# Patient Record
Sex: Male | Born: 1960 | Race: White | Hispanic: No | Marital: Married | Smoking: Never smoker
Health system: Southern US, Community
[De-identification: ages and names within clinical notes are randomized; demographics above are authoritative.]

## PROBLEM LIST (undated history)

## (undated) DIAGNOSIS — M659 Synovitis and tenosynovitis, unspecified: Secondary | ICD-10-CM

## (undated) DIAGNOSIS — M25462 Effusion, left knee: Secondary | ICD-10-CM

## (undated) DIAGNOSIS — K529 Noninfective gastroenteritis and colitis, unspecified: Secondary | ICD-10-CM

## (undated) DIAGNOSIS — M2392 Unspecified internal derangement of left knee: Secondary | ICD-10-CM

## (undated) DIAGNOSIS — S83249A Other tear of medial meniscus, current injury, unspecified knee, initial encounter: Secondary | ICD-10-CM

## (undated) HISTORY — DX: Synovitis and tenosynovitis, unspecified: M65.9

## (undated) HISTORY — DX: Unspecified internal derangement of left knee: M23.92

## (undated) HISTORY — DX: Other tear of medial meniscus, current injury, unspecified knee, initial encounter: S83.249A

## (undated) HISTORY — DX: Effusion, left knee: M25.462

## (undated) HISTORY — DX: Noninfective gastroenteritis and colitis, unspecified: K52.9

---

## 2004-08-13 ENCOUNTER — Ambulatory Visit: Payer: Self-pay | Admitting: Internal Medicine

## 2004-08-20 ENCOUNTER — Encounter: Admission: RE | Admit: 2004-08-20 | Discharge: 2004-08-20 | Payer: Self-pay | Admitting: Internal Medicine

## 2004-08-20 ENCOUNTER — Ambulatory Visit: Payer: Self-pay | Admitting: Internal Medicine

## 2005-04-29 DIAGNOSIS — K6389 Other specified diseases of intestine: Secondary | ICD-10-CM

## 2005-04-29 HISTORY — DX: Other specified diseases of intestine: K63.89

## 2005-08-28 ENCOUNTER — Ambulatory Visit: Payer: Self-pay | Admitting: Internal Medicine

## 2005-11-04 ENCOUNTER — Ambulatory Visit: Payer: Self-pay | Admitting: Internal Medicine

## 2006-03-29 LAB — HM COLONOSCOPY

## 2006-06-05 ENCOUNTER — Ambulatory Visit: Payer: Self-pay | Admitting: Internal Medicine

## 2006-10-23 ENCOUNTER — Telehealth (INDEPENDENT_AMBULATORY_CARE_PROVIDER_SITE_OTHER): Payer: Self-pay | Admitting: *Deleted

## 2007-03-08 ENCOUNTER — Telehealth: Payer: Self-pay | Admitting: Internal Medicine

## 2007-12-21 ENCOUNTER — Ambulatory Visit: Payer: Self-pay | Admitting: Internal Medicine

## 2007-12-21 DIAGNOSIS — M109 Gout, unspecified: Secondary | ICD-10-CM

## 2007-12-23 ENCOUNTER — Telehealth (INDEPENDENT_AMBULATORY_CARE_PROVIDER_SITE_OTHER): Payer: Self-pay | Admitting: *Deleted

## 2007-12-23 LAB — CONVERTED CEMR LAB
AST: 21 units/L (ref 0–37)
Albumin: 4 g/dL (ref 3.5–5.2)
Basophils Absolute: 0 10*3/uL (ref 0.0–0.1)
Basophils Relative: 0.3 % (ref 0.0–3.0)
Bilirubin, Direct: 0.1 mg/dL (ref 0.0–0.3)
CO2: 31 meq/L (ref 19–32)
Calcium: 9.2 mg/dL (ref 8.4–10.5)
Chloride: 108 meq/L (ref 96–112)
Eosinophils Relative: 0.9 % (ref 0.0–5.0)
Glucose, Bld: 90 mg/dL (ref 70–99)
HCT: 45.2 % (ref 39.0–52.0)
Lymphocytes Relative: 18.6 % (ref 12.0–46.0)
MCV: 86.2 fL (ref 78.0–100.0)
Monocytes Absolute: 1 10*3/uL (ref 0.1–1.0)
Monocytes Relative: 10.3 % (ref 3.0–12.0)
Platelets: 287 10*3/uL (ref 150–400)
RBC: 5.24 M/uL (ref 4.22–5.81)
RDW: 12.2 % (ref 11.5–14.6)

## 2008-01-22 ENCOUNTER — Ambulatory Visit: Payer: Self-pay | Admitting: Internal Medicine

## 2008-01-25 ENCOUNTER — Telehealth (INDEPENDENT_AMBULATORY_CARE_PROVIDER_SITE_OTHER): Payer: Self-pay | Admitting: *Deleted

## 2008-04-18 ENCOUNTER — Ambulatory Visit: Payer: Self-pay | Admitting: Internal Medicine

## 2008-04-18 DIAGNOSIS — J069 Acute upper respiratory infection, unspecified: Secondary | ICD-10-CM | POA: Insufficient documentation

## 2008-04-18 DIAGNOSIS — H6120 Impacted cerumen, unspecified ear: Secondary | ICD-10-CM

## 2008-10-25 ENCOUNTER — Ambulatory Visit: Payer: Self-pay | Admitting: Internal Medicine

## 2008-10-25 DIAGNOSIS — J019 Acute sinusitis, unspecified: Secondary | ICD-10-CM | POA: Insufficient documentation

## 2009-05-19 ENCOUNTER — Telehealth (INDEPENDENT_AMBULATORY_CARE_PROVIDER_SITE_OTHER): Payer: Self-pay | Admitting: *Deleted

## 2010-05-29 NOTE — Progress Notes (Signed)
  Phone Note Call from Patient   Caller: Patient Summary of Call: pt called for rx allopurinol.  rx has been filled since 05/05/09.  Called [facility] for verification, has rx ready for pt.  Called pt left msg to check with pharmacy rx is ready .Kandice Hams  May 19, 2009 12:30 PM  Initial call taken by: Kandice Hams,  May 19, 2009 12:30 PM

## 2010-08-24 ENCOUNTER — Ambulatory Visit (INDEPENDENT_AMBULATORY_CARE_PROVIDER_SITE_OTHER): Payer: BC Managed Care – PPO | Admitting: Internal Medicine

## 2010-08-24 ENCOUNTER — Encounter: Payer: Self-pay | Admitting: Internal Medicine

## 2010-08-24 VITALS — BP 132/88 | HR 98 | Temp 98.1°F | Wt 297.4 lb

## 2010-08-24 DIAGNOSIS — M109 Gout, unspecified: Secondary | ICD-10-CM

## 2010-08-24 MED ORDER — COLCHICINE 0.6 MG PO TABS: 0.6000 mg | ORAL_TABLET | Freq: Three times a day (TID) | ORAL | Status: DC | PRN

## 2010-08-24 MED ORDER — ALLOPURINOL 100 MG PO TABS: 100.0000 mg | ORAL_TABLET | Freq: Every day | ORAL | Status: DC

## 2010-08-24 NOTE — Patient Instructions (Signed)
The first week of allopurinol, take colchicine twice a day every day,then as needed You are due for a physical exam, please schedule!

## 2010-08-24 NOTE — Progress Notes (Signed)
  Subjective:    Patient ID: Danny [name], male    DOB: 10/12/1960, 50 y.o.   MRN: [id number]  HPI Last office visit about 2 years ago. He ran out of allopurinol about 6 months ago. Since then he is having on and off pain at the right foot, dorsum, close to the 2nd and 3th toes. Usually "one or 2 colchicines take care of the problem". No classic podagra. Patient likes his allopurinol and colchicine RF refill  Past Medical History  Diagnosis Date  . Gout   . Epiploic appendagitis 2007   No past surgical history on file.  Review of Systems No chest pain or shortness of breath Note nausea and vomiting or diarrhea.    Objective:   Physical Exam Alert oriented in no apparent distress Lungs are clear to auscultation bilaterally CV: RRR, no murmur Lower extremity edema normal to inspection, good pedal pulses, good capillary refill. No swelling, redness   at the dorsum of the right foot. Mild tenderness.         Assessment & Plan:

## 2010-08-24 NOTE — Assessment & Plan Note (Signed)
Pt c/o foot pain that decreases w/ colchicine, pain is atypical for gout; these was d/w pt, he is reluctant to consider other conditions. Plan: Labs meds If uric acid is normal will have to consider ortho referal ([facility]? [facility]?) Needs physical, pt aware See instructions

## 2010-08-25 LAB — BASIC METABOLIC PANEL
BUN: 16 mg/dL (ref 6–23)
CO2: 29 mEq/L (ref 19–32)
Calcium: 10 mg/dL (ref 8.4–10.5)
Creat: 0.89 mg/dL (ref 0.40–1.50)
Glucose, Bld: 92 mg/dL (ref 70–99)

## 2010-08-25 LAB — ALT: ALT: 37 U/L (ref 0–53)

## 2010-08-27 ENCOUNTER — Telehealth: Payer: Self-pay | Admitting: *Deleted

## 2010-08-27 NOTE — Telephone Encounter (Signed)
Message copied by Army Fossa on Mon Aug 27, 2010 10:17 AM ------      Message from: Willow Ora      Created: Sun Aug 26, 2010  3:04 PM       Advise patient:      Uric acid is high as expected in gout. Other labs normal. Plan is the same

## 2010-08-27 NOTE — Telephone Encounter (Signed)
Message left for patient to return my call.  

## 2010-08-28 ENCOUNTER — Other Ambulatory Visit: Payer: Self-pay | Admitting: Internal Medicine

## 2010-08-28 NOTE — Telephone Encounter (Signed)
Message left for patient to return my call.  

## 2010-08-28 NOTE — Telephone Encounter (Signed)
Pharm did not receive

## 2010-08-29 NOTE — Telephone Encounter (Signed)
Message left for patient to return my call.  

## 2010-08-30 ENCOUNTER — Encounter: Payer: Self-pay | Admitting: *Deleted

## 2010-08-30 NOTE — Telephone Encounter (Signed)
Mailed pt a letter

## 2010-09-14 NOTE — Assessment & Plan Note (Signed)
[facility]                                   ON-CALL NOTE   NAME:[name], Danny HENDRICKSEN                       MRN:          [id number]  DATE:01/14/2006                            DOB:          03/06/1961    The patient called in from [place], [place], stating he has gout.  He said he has tried the colchicine, and it has not helped.  He  has had  these symptoms for a day and a half; however, he did not have time to call  Dr. Drue Novel today because he was too busy.  The patient wants medicine called  in.  Advised him if he has any problems he will need to go to a local urgent  care for eval.  Once that is done, then he needs to see Dr. Drue Novel tomorrow  when he gets back in town.  However, the patient stated he did not have time  to see Dr. Drue Novel tomorrow, he just wanted medicine called in.  Advised the  patient to get medicine at local urgent care tonight, and see Dr. Drue Novel for  eval, since he is having recurrent episodes of gout.                                   [place] A. Tawanna Cooler, MD   JAT/MedQ  DD:  01/14/2006  DT:  01/16/2006  Job #:  [id number]   cc:   Willow Ora, MD

## 2010-09-19 ENCOUNTER — Ambulatory Visit (INDEPENDENT_AMBULATORY_CARE_PROVIDER_SITE_OTHER): Payer: BC Managed Care – PPO | Admitting: Internal Medicine

## 2010-09-19 ENCOUNTER — Encounter: Payer: Self-pay | Admitting: Internal Medicine

## 2010-09-19 DIAGNOSIS — Z0000 Encounter for general adult medical examination without abnormal findings: Secondary | ICD-10-CM

## 2010-09-19 DIAGNOSIS — M109 Gout, unspecified: Secondary | ICD-10-CM

## 2010-09-19 DIAGNOSIS — Z23 Encounter for immunization: Secondary | ICD-10-CM

## 2010-09-19 DIAGNOSIS — Z136 Encounter for screening for cardiovascular disorders: Secondary | ICD-10-CM

## 2010-09-19 LAB — CBC WITH DIFFERENTIAL/PLATELET
Basophils Absolute: 0 10*3/uL (ref 0.0–0.1)
Basophils Relative: 0.5 % (ref 0.0–3.0)
HCT: 45.8 % (ref 39.0–52.0)
Hemoglobin: 15.8 g/dL (ref 13.0–17.0)
MCHC: 34.6 g/dL (ref 30.0–36.0)
Monocytes Absolute: 0.7 10*3/uL (ref 0.1–1.0)
Neutro Abs: 4.5 10*3/uL (ref 1.4–7.7)
Platelets: 280 10*3/uL (ref 150.0–400.0)

## 2010-09-19 LAB — LIPID PANEL
Total CHOL/HDL Ratio: 4
Triglycerides: 122 mg/dL (ref 0.0–149.0)
VLDL: 24.4 mg/dL (ref 0.0–40.0)

## 2010-09-19 LAB — TSH: TSH: 0.96 u[IU]/mL (ref 0.35–5.50)

## 2010-09-19 LAB — URIC ACID: Uric Acid, Serum: 8.4 mg/dL — ABNORMAL HIGH (ref 4.0–7.8)

## 2010-09-19 NOTE — Assessment & Plan Note (Addendum)
Td 1999 and today  FH Colon cancer, pt reports a Cscope w/ Dr Candelaria Stagers 2007: will call GI and get report and next Cscope. (addendum: we called GI, next cscope due 03-2011) occ diarrhea (see ROS) , rec observation , will call if sx increase  No FH prostate ca Labs Diet Exercise  EKG (no old EKGs) NSR , no acute changes

## 2010-09-19 NOTE — Assessment & Plan Note (Addendum)
Improved from recent attack.  Uric Acid was  ~9.  Was re-started on allopurinol Labs

## 2010-09-19 NOTE — Progress Notes (Signed)
  Subjective:    Patient ID: Danny [name], male    DOB: [birth year], 50 y.o.   MRN: [id number]  HPI CPX  Past Medical History  Diagnosis Date  . Gout   . Epiploic appendagitis 2007   No past surgical history on file.  History   Social History  . Marital Status: Married    Spouse Name: N/A    Number of Children: 2  . Years of Education: N/A   Occupational History  . appartment Marketing executive     Social History Main Topics  . Smoking status: Never Smoker   . Smokeless tobacco: Not on file  . Alcohol Use: Yes     socially  . Drug Use: No  . Sexually Active: Not on file   Other Topics Concern  . Not on file   Social History Narrative   Diet: not too good -----exercise: yard work no routine exercise    Family History  Problem Relation Age of Onset  . Adopted: Yes  . Prostate cancer Neg Hx   . Colon cancer Mother 51  . Hypertension Mother   . Coronary artery disease Father 74    F MI  . Stroke Father 49       Review of Systems  Constitutional: Negative for fever and fatigue.  Respiratory: Negative for cough and shortness of breath.   Cardiovascular: Negative for chest pain and leg swelling.  Gastrointestinal: Negative for nausea, abdominal pain and blood in stool.       Occ diarrhea, better after taking vitamins, wonders about IBS  Genitourinary: Negative for dysuria, hematuria and difficulty urinating.  Psychiatric/Behavioral:       No anxiety, depression       Objective:   Physical Exam  Constitutional: He is oriented to person, place, and time. He appears well-developed and well-nourished. No distress.  HENT:  Head: Normocephalic and atraumatic.  Neck: No thyromegaly present.       Normal carotid pulse   Cardiovascular: Normal rate, regular rhythm and normal heart sounds.   No murmur heard. Pulmonary/Chest: Effort normal and breath sounds normal. No respiratory distress. He has no wheezes. He has no rales.  Abdominal: Soft. He exhibits no  distension. There is no tenderness. There is no rebound and no guarding.  Musculoskeletal: He exhibits no edema.  Neurological: He is alert and oriented to person, place, and time.  Skin: Skin is warm and dry. He is not diaphoretic.  Psychiatric: He has a normal mood and affect. His behavior is normal. Judgment and thought content normal.       Assessment & Plan:

## 2010-09-19 NOTE — Patient Instructions (Signed)
Continue same meds If the labs are ok and you feel well, came back in 1 year

## 2010-09-21 ENCOUNTER — Telehealth: Payer: Self-pay | Admitting: *Deleted

## 2010-09-21 NOTE — Telephone Encounter (Signed)
Message left for patient to return my call.  

## 2010-09-21 NOTE — Telephone Encounter (Signed)
Message copied by Leanne Lovely on Fri Sep 21, 2010  9:57 AM ------      Message from: Willow Ora E      Created: Thu Sep 20, 2010  6:27 PM       Advise patient:      His cholesterol is very good !      The liver , thyroid test  and blood count are normal      The uric acid is still elevated, increase allopurinol 100 mg to  2 tablets a day. The first week after he increased the dosage, he needs to take colchicine 3 times a day to prevent a gout attack.      Call a new allopurinol  prescription.      Advised patient also to come back in 4 months to check on his gout.

## 2010-09-21 NOTE — Telephone Encounter (Signed)
Left message for pt on his cell phone.

## 2010-09-25 NOTE — Telephone Encounter (Signed)
Message left for patient to return my call. (on home number, man on cell states this was not Mr.Reeps phone)

## 2010-09-26 NOTE — Telephone Encounter (Signed)
Left message for pt

## 2010-09-27 ENCOUNTER — Encounter: Payer: Self-pay | Admitting: *Deleted

## 2010-09-27 MED ORDER — ALLOPURINOL 100 MG PO TABS: ORAL_TABLET | ORAL | Status: DC

## 2010-09-27 NOTE — Telephone Encounter (Signed)
Mailed letter and rx.

## 2010-10-01 ENCOUNTER — Other Ambulatory Visit: Payer: Self-pay | Admitting: Internal Medicine

## 2010-10-02 NOTE — Telephone Encounter (Signed)
90 and 1 RF 

## 2010-11-12 ENCOUNTER — Telehealth: Payer: Self-pay | Admitting: *Deleted

## 2010-11-12 NOTE — Telephone Encounter (Signed)
Correction of medication

## 2010-11-16 ENCOUNTER — Other Ambulatory Visit: Payer: Self-pay | Admitting: Internal Medicine

## 2010-11-16 MED ORDER — ALLOPURINOL 100 MG PO TABS: ORAL_TABLET | ORAL | Status: DC

## 2010-11-16 NOTE — Telephone Encounter (Signed)
Sent in

## 2011-04-19 ENCOUNTER — Ambulatory Visit: Payer: BC Managed Care – PPO | Admitting: Internal Medicine

## 2012-02-28 ENCOUNTER — Encounter: Payer: Self-pay | Admitting: Internal Medicine

## 2012-02-28 ENCOUNTER — Ambulatory Visit (INDEPENDENT_AMBULATORY_CARE_PROVIDER_SITE_OTHER): Payer: BC Managed Care – PPO | Admitting: Internal Medicine

## 2012-02-28 DIAGNOSIS — R059 Cough, unspecified: Secondary | ICD-10-CM

## 2012-02-28 DIAGNOSIS — J3489 Other specified disorders of nose and nasal sinuses: Secondary | ICD-10-CM

## 2012-02-28 DIAGNOSIS — J019 Acute sinusitis, unspecified: Secondary | ICD-10-CM

## 2012-02-28 DIAGNOSIS — R05 Cough: Secondary | ICD-10-CM

## 2012-02-28 MED ORDER — AZITHROMYCIN 250 MG PO TABS: ORAL_TABLET | ORAL | Status: DC

## 2012-02-28 MED ORDER — HYDROCODONE-HOMATROPINE 5-1.5 MG/5ML PO SYRP: 5.0000 mL | ORAL_SOLUTION | Freq: Three times a day (TID) | ORAL | Status: DC | PRN

## 2012-02-28 NOTE — Progress Notes (Signed)
HPI  Pt presents to the clinic today with cough, sinus pain and pressure for 4 days.  Pt feels like he has been running fever but has not actually taken his temperature. Pt feels like his chest is congested and the cough is worse when he tries to lay down. Pt produces only small amounts of green sputum. Pt has been around many sick contacts at work.  Review of Systems    Past Medical History  Diagnosis Date  . Gout   . Epiploic appendagitis 2007    Family History  Problem Relation Age of Onset  . Adopted: Yes  . Prostate cancer Neg Hx   . Colon cancer Mother 38  . Hypertension Mother   . Coronary artery disease Father 35    F MI  . Stroke Father 41    History   Social History  . Marital Status: Married    Spouse Name: N/A    Number of Children: 2  . Years of Education: N/A   Occupational History  . appartment Marketing executive     Social History Main Topics  . Smoking status: Never Smoker   . Smokeless tobacco: Not on file  . Alcohol Use: Yes     socially  . Drug Use: No  . Sexually Active: Not on file   Other Topics Concern  . Not on file   Social History Narrative   Diet: not too good -----exercise: yard work no routine exercise     Allergies  Allergen Reactions  . Penicillins      Constitutional: Positive headache, fatigue and fever. Denies abrupt weight changes.  HEENT:  Positive eye pain, pressure behind the eyes, facial pain, nasal congestion and sore throat. Denies eye redness, ear pain, ringing in the ears, wax buildup, runny nose or bloody nose. Respiratory: Positive cough and thick green sputum production. Denies difficulty breathing or shortness of breath.  Cardiovascular: Denies chest pain, chest tightness, palpitations or swelling in the hands or feet.   No other specific complaints in a complete review of systems (except as listed in HPI above).  Objective:    General: Appears his stated age, well developed, well nourished in  NAD. HEENT: Head: normal shape and size; Eyes: sclera white, no icterus, conjunctiva pink, PERRLA and EOMs intact; Ears: Tm's gray and intact, normal light reflex; Nose: mucosa pink and moist, septum midline; Throat/Mouth: + PND. Teeth present, mucosa mildly erythematic and moist, no exudate noted, no lesions or ulcerations noted.  Neck: Mild cervical lymphadenopathy. Neck supple, trachea midline. No massses, lumps or thyromegaly present.  Cardiovascular: Normal rate and rhythm. S1,S2 noted.  No murmur, rubs or gallops noted. No JVD or BLE edema. No carotid bruits noted. Pulmonary/Chest: Normal effort and positive vesicular breath sounds.  Upper lobes are slightly diminished but upper lobes are clear bilaterally. No respiratory distress. No wheezes, rales or ronchi noted.      Assessment & Plan:  Cough Acute bacterial sinusitis  Can use a Neti Pot which can be purchased from your local drug store. Azithromax 250 mg x 5 days Hycodan cough syrup for bedtime  RTC as needed or if symptoms persist.

## 2012-02-28 NOTE — Patient Instructions (Signed)

## 2013-04-26 ENCOUNTER — Ambulatory Visit (INDEPENDENT_AMBULATORY_CARE_PROVIDER_SITE_OTHER): Payer: BC Managed Care – PPO | Admitting: *Deleted

## 2013-04-26 DIAGNOSIS — Z2911 Encounter for prophylactic immunotherapy for respiratory syncytial virus (RSV): Secondary | ICD-10-CM

## 2013-04-26 DIAGNOSIS — Z23 Encounter for immunization: Secondary | ICD-10-CM

## 2014-06-28 ENCOUNTER — Ambulatory Visit (HOSPITAL_BASED_OUTPATIENT_CLINIC_OR_DEPARTMENT_OTHER)
Admission: RE | Admit: 2014-06-28 | Discharge: 2014-06-28 | Disposition: A | Discharge status: Home / Self Care | Payer: 59 | Source: Ambulatory Visit | Attending: Internal Medicine | Admitting: Internal Medicine

## 2014-06-28 ENCOUNTER — Ambulatory Visit (INDEPENDENT_AMBULATORY_CARE_PROVIDER_SITE_OTHER): Payer: 59 | Admitting: Internal Medicine

## 2014-06-28 ENCOUNTER — Encounter: Payer: Self-pay | Admitting: Internal Medicine

## 2014-06-28 VITALS — BP 155/113 | HR 131 | Temp 99.0°F | Ht 78.0 in | Wt 309.5 lb

## 2014-06-28 DIAGNOSIS — R059 Cough, unspecified: Secondary | ICD-10-CM

## 2014-06-28 DIAGNOSIS — R05 Cough: Secondary | ICD-10-CM

## 2014-06-28 DIAGNOSIS — R52 Pain, unspecified: Secondary | ICD-10-CM

## 2014-06-28 DIAGNOSIS — B349 Viral infection, unspecified: Secondary | ICD-10-CM

## 2014-06-28 LAB — POCT RAPID STREP A (OFFICE): Rapid Strep A Screen: NEGATIVE

## 2014-06-28 LAB — POCT INFLUENZA A/B
INFLUENZA A, POC: NEGATIVE
Influenza B, POC: NEGATIVE

## 2014-06-28 MED ORDER — OSELTAMIVIR PHOSPHATE 75 MG PO CAPS: 75.0000 mg | ORAL_CAPSULE | Freq: Two times a day (BID) | ORAL | Status: DC

## 2014-06-28 MED ORDER — HYDROCODONE-HOMATROPINE 5-1.5 MG/5ML PO SYRP: 5.0000 mL | ORAL_SOLUTION | Freq: Three times a day (TID) | ORAL | Status: DC | PRN

## 2014-06-28 NOTE — Progress Notes (Signed)
Subjective:    Patient ID: Danny RowanJeffrey T [name], male    DOB: [birth year], 54 y.o.   MRN: [id number]  DOS:  06/28/2014 Type of visit - description : acute Interval history: Symptoms started yesterday with dry and persisting cough, aching all over and a low-grade fever. He is taking a syrup with codeine, Tylenol every 6 hours, and Mucinex this morning. After Mucinex he saw a small amount of sputum coming out. Family members are not affected by any symptoms but reportedly there has been some people sick at work.   Review of Systems Denies any sinus pain or congestion No chest pain or difficulty breathing; no palpitations No nausea, vomiting, diarrhea No hemoptysis Minimal headache, no rash  Past Medical History  Diagnosis Date  . Gout   . Epiploic appendagitis 2007    No past surgical history on file.  History   Social History  . Marital Status: Married    Spouse Name: N/A  . Number of Children: 2  . Years of Education: N/A   Occupational History  . appartment Marketing executivecommunity manager     Social History Main Topics  . Smoking status: Never Smoker   . Smokeless tobacco: Not on file  . Alcohol Use: Yes     Comment: socially  . Drug Use: No  . Sexual Activity: Not on file   Other Topics Concern  . Not on file   Social History Narrative   Diet: not too good -----exercise: yard work no routine exercise         Medication List       This list is accurate as of: 06/28/14  2:13 PM.  Always use your most recent med list.               ADVIL COLD & SINUS LIQUI-GELS 30-200 MG Caps  Generic drug:  Pseudoephedrine-Ibuprofen  Take 1 capsule by mouth as needed.     allopurinol 100 MG tablet  Commonly known as:  ZYLOPRIM  Take 2 tablets daily.     aspirin 81 MG tablet  Take 81 mg by mouth daily.     azithromycin 250 MG tablet  Commonly known as:  ZITHROMAX  Take 2 tablets today, then 1 tablet each day for the next 4 days     HYDROcodone-homatropine 5-1.5 MG/5ML syrup    Commonly known as:  HYCODAN  Take 5 mLs by mouth every 8 (eight) hours as needed for cough.     MENS MULTIVITAMIN PLUS PO  Take by mouth daily.           Objective:   Physical Exam BP 155/113 mmHg  Pulse 131  Temp(Src) 99 F (37.2 C) (Oral)  Ht 6\' 6"  (1.981 m)  Wt 309 lb 8 oz (140.388 kg)  BMI 35.77 kg/m2  SpO2 93% General:   Well developed, well nourished . Not diaphoretic, pale or in acute distress. Nontoxic-appearing.  HEENT:  Normocephalic . Face symmetric, atraumatic; nose congested, sinuses not tender to palpation. Throat symmetric, slightly red, no discharge Right TM obscured by wax, left TM normal Neck is full range of motion Lungs:  CTA B Normal respiratory effort, no intercostal retractions, no accessory muscle use. Heart: No murmur, tachycardic with a heart rate of around 110.  Abdomen:  Not distended, soft, non-tender. No rebound or rigidity. No mass,organomegaly Muscle skeletal: no pretibial edema bilaterally  Skin: Not pale. Not jaundice; no rash Neurologic:  alert & oriented X3.  Speech normal, gait appropriate for age and unassisted  Psych--  Cognition and judgment appear intact.  Cooperative with normal attention span and concentration.  Behavior appropriate. No anxious or depressed appearing.       Assessment & Plan:    Viral syndrome, Rapid flu test and strep test negative. The patient wonders if he has the flu and if tamiflu  is appropriate. This could be the flu despite the negative tests consequently Tamiflu is okay. The patient was initially tachycardic at 131, I recheck her his heart rate is 110. He does not look toxic. Plan: Chest x-ray, Tamiflu, rest, fluids, codeine for cough control. ER if symptoms increase, rash, headache etc. See instructions  Also BP elevated, at a screening las year it was 140/86 per pt . Recommend to return to the office for a routine visit

## 2014-06-28 NOTE — Progress Notes (Signed)
Pre visit review using our clinic review tool, if applicable. No additional management support is needed unless otherwise documented below in the visit note. 

## 2014-06-28 NOTE — Patient Instructions (Signed)
Stop by the first floor and get the XR   Rest, fluids , tylenol  If  cough, take Mucinex DM twice a day as needed  Take hydrocodone as needed for persisting cough  Tamiflu was prescribed   Call if not gradually better over the next  10 days  Call anytime if the symptoms are severe, you have high fever, short of breath, chest pain, severe headache, a rash

## 2014-06-30 ENCOUNTER — Telehealth: Payer: Self-pay | Admitting: Internal Medicine

## 2014-06-30 NOTE — Telephone Encounter (Signed)
please call patient on the cell

## 2014-06-30 NOTE — Telephone Encounter (Signed)
Spoke with Pt, informed him of chest x-ray results. Pt informed he is feeling much better and is planning to return work on Friday July 01, 2014. Pt requested work note from March 1 to March 3, and requested I fax to [phone]. Received confirmation fax 06/30/2014 at 15:05.

## 2014-07-01 NOTE — Telephone Encounter (Signed)
Ok to send the work excuse, thx!

## 2014-08-25 LAB — HM COLONOSCOPY: HM COLON: NORMAL

## 2015-04-30 DIAGNOSIS — M65962 Unspecified synovitis and tenosynovitis, left lower leg: Secondary | ICD-10-CM

## 2015-04-30 DIAGNOSIS — M659 Synovitis and tenosynovitis, unspecified: Secondary | ICD-10-CM

## 2015-04-30 DIAGNOSIS — M2392 Unspecified internal derangement of left knee: Secondary | ICD-10-CM

## 2015-04-30 DIAGNOSIS — S83249A Other tear of medial meniscus, current injury, unspecified knee, initial encounter: Secondary | ICD-10-CM

## 2015-04-30 DIAGNOSIS — M25462 Effusion, left knee: Secondary | ICD-10-CM

## 2015-04-30 HISTORY — DX: Unspecified internal derangement of left knee: M23.92

## 2015-04-30 HISTORY — DX: Unspecified synovitis and tenosynovitis, left lower leg: M65.962

## 2015-04-30 HISTORY — DX: Synovitis and tenosynovitis, unspecified: M65.9

## 2015-04-30 HISTORY — DX: Effusion, left knee: M25.462

## 2015-04-30 HISTORY — DX: Other tear of medial meniscus, current injury, unspecified knee, initial encounter: S83.249A

## 2015-04-30 HISTORY — PX: KNEE ARTHROSCOPY WITH LATERAL MENISECTOMY: SHX6193

## 2015-10-18 IMAGING — DX DG CHEST 2V
3 series · 3 of 3 positions shown · non-contrast
Comparison: None.

CLINICAL DATA: Cough for 1 day

EXAM:
CHEST  2 VIEW

[chest pa]
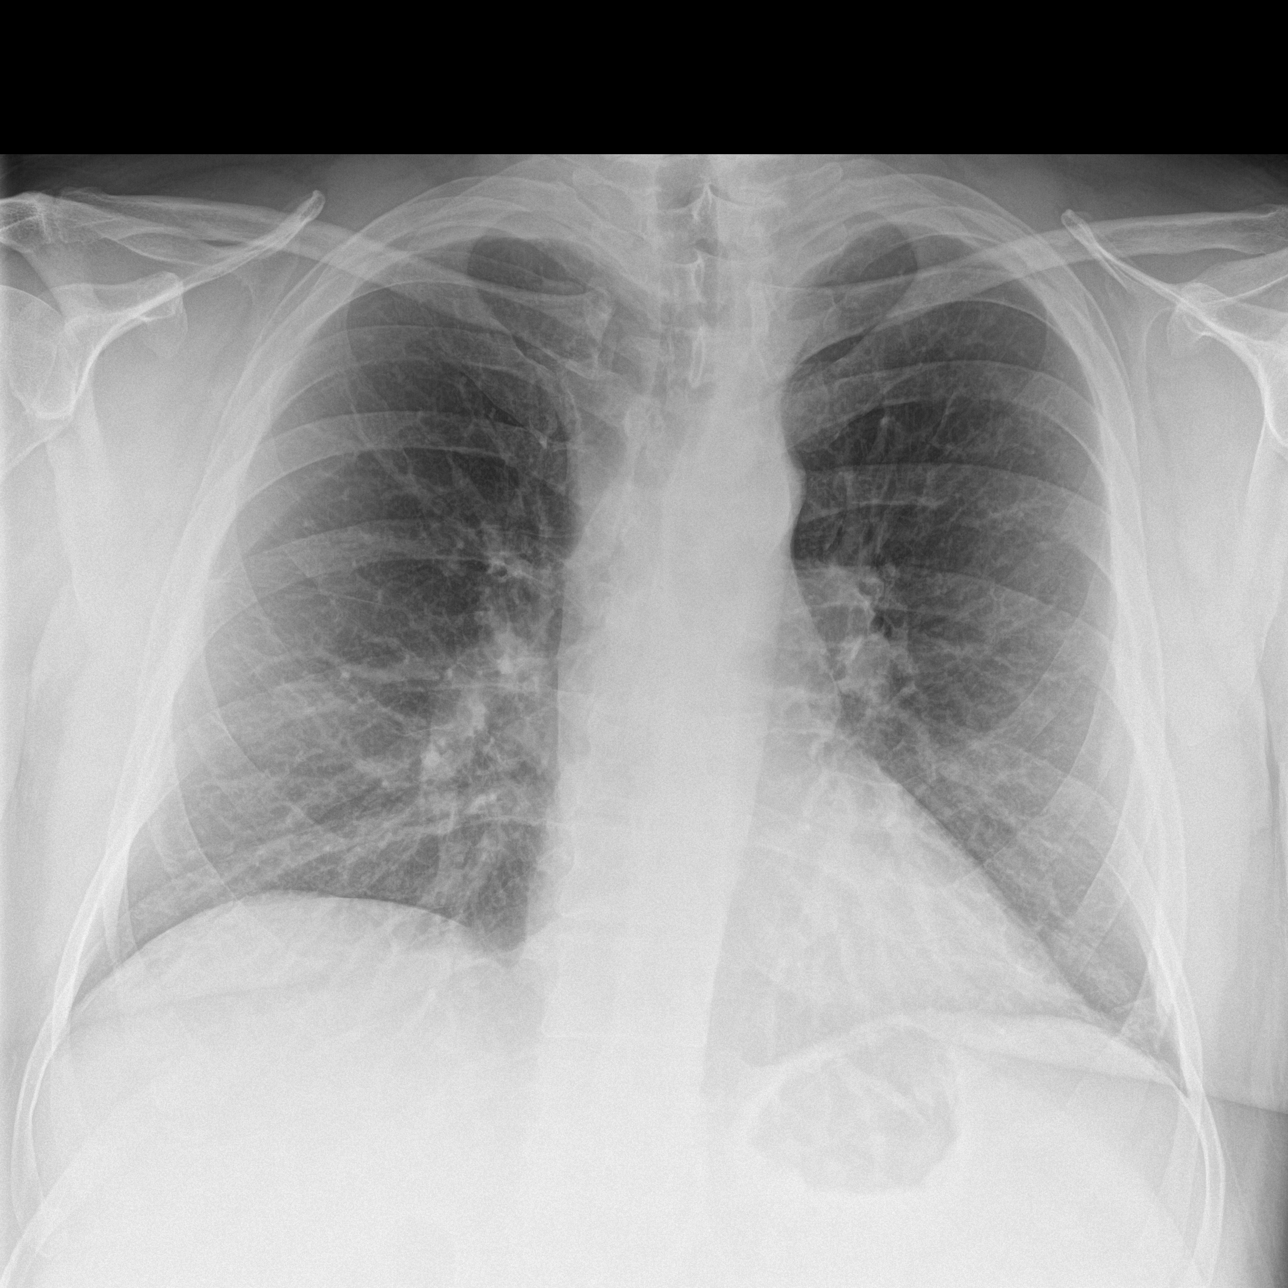

[chest lat (1 of 2)]
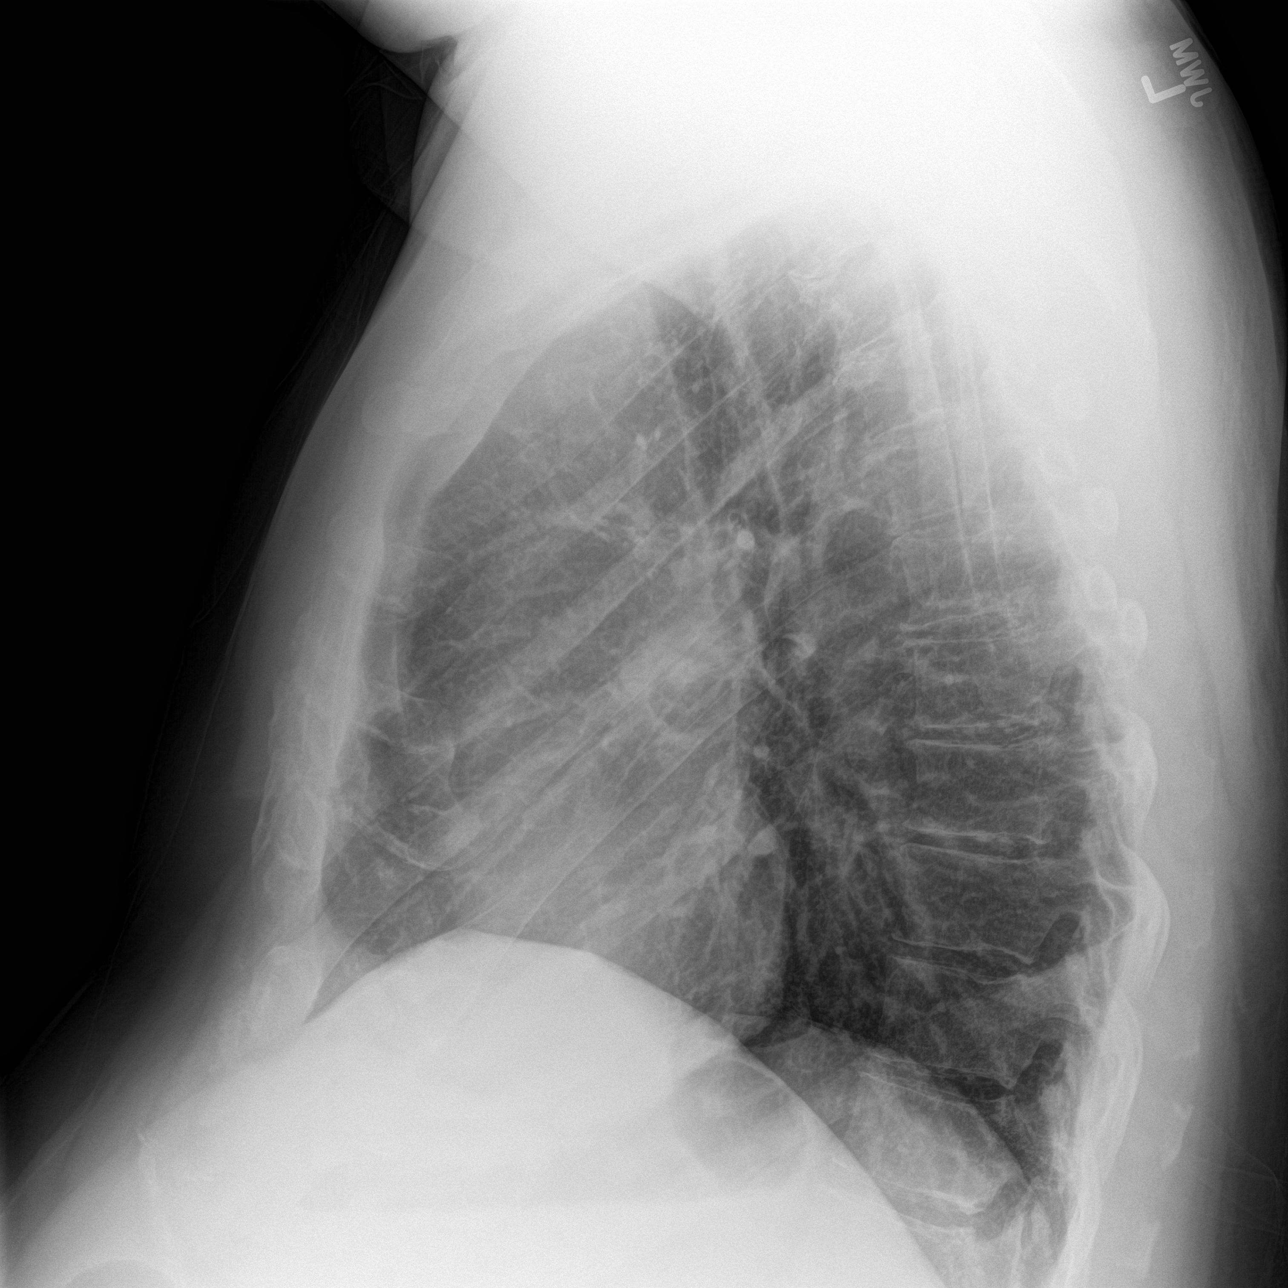

[chest lat (2 of 2)]
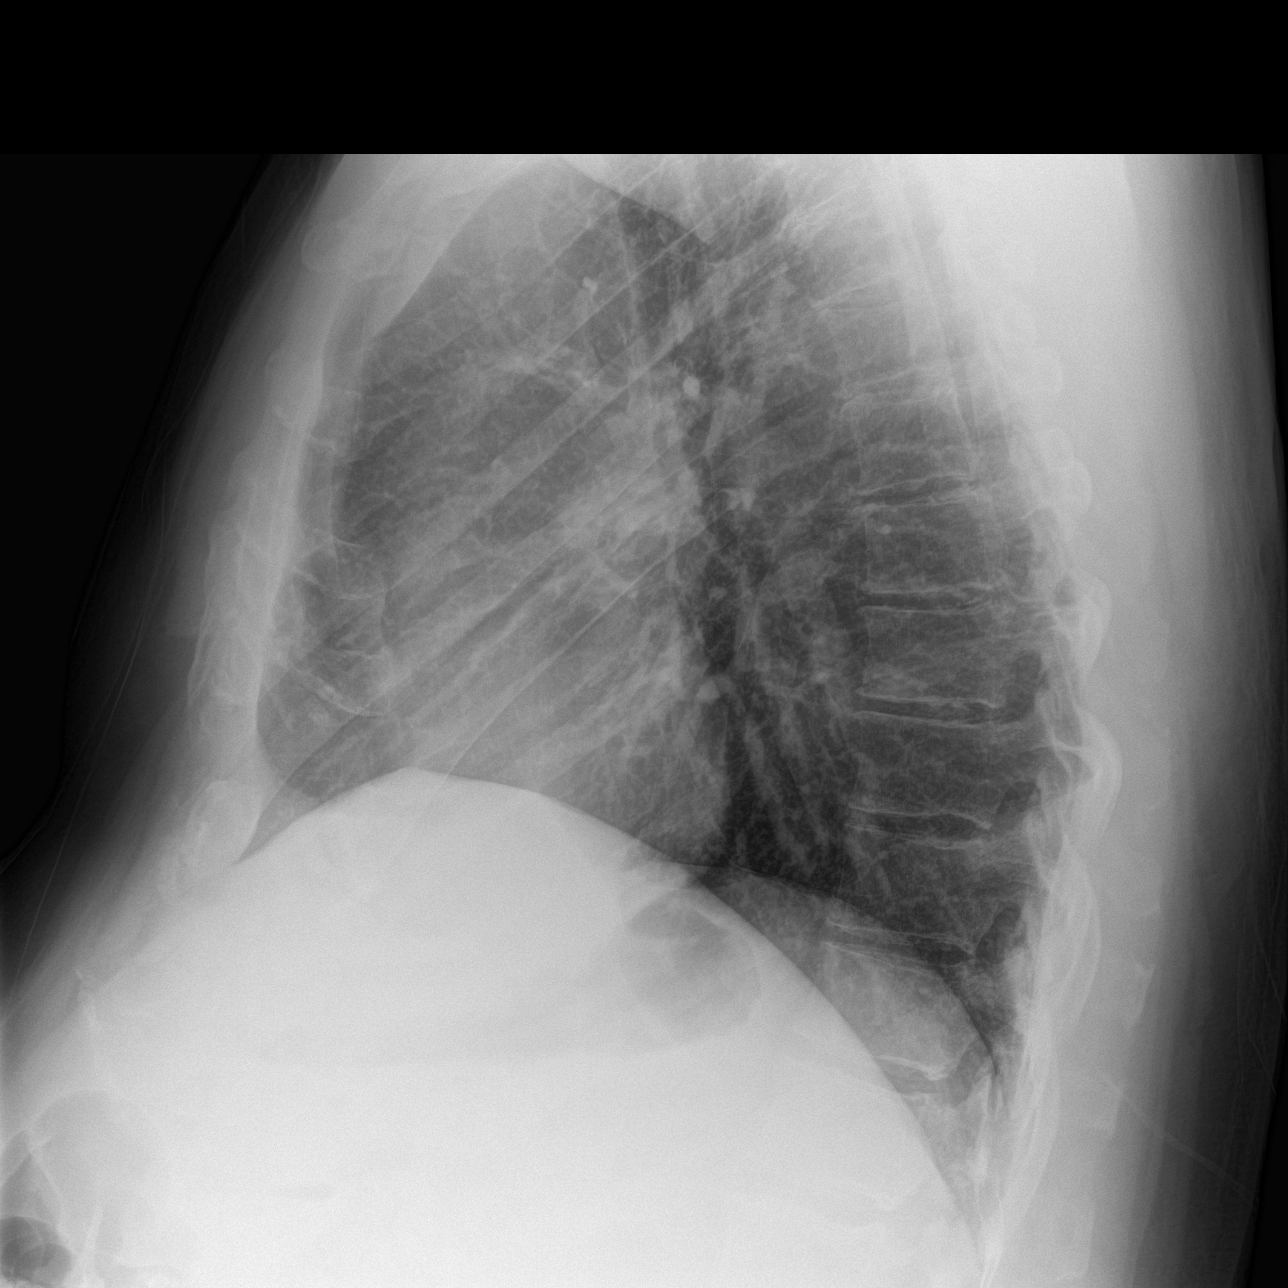

[3 of 3 positions shown; findings below may reference images not displayed]

FINDINGS: The heart size and mediastinal contours are within normal limits.
Both lungs are clear. The visualized skeletal structures are
unremarkable.
IMPRESSION: No active cardiopulmonary disease.

## 2015-11-07 ENCOUNTER — Encounter: Payer: 59 | Admitting: Internal Medicine

## 2016-02-19 ENCOUNTER — Telehealth: Payer: Self-pay | Admitting: Internal Medicine

## 2016-02-19 NOTE — Telephone Encounter (Signed)
Pt says that he has to have a CPE before the month is out. Informed pt of providers next available CPE (January) He says that he need it before the month is out due to his insurance. Please advise for scheduling.    CB: [phone]

## 2016-02-19 NOTE — Telephone Encounter (Signed)
Okay to use 2-15 minute appts, or hosp follow-up slot.

## 2016-02-21 NOTE — Telephone Encounter (Signed)
lvm for pt to call me back to schedule appt.

## 2016-02-23 NOTE — Telephone Encounter (Signed)
Pt has been scheduled.  °

## 2016-02-27 ENCOUNTER — Ambulatory Visit (INDEPENDENT_AMBULATORY_CARE_PROVIDER_SITE_OTHER): Payer: 59 | Admitting: Internal Medicine

## 2016-02-27 VITALS — BP 118/96 | HR 90 | Temp 97.7°F | Ht 77.0 in | Wt 316.0 lb

## 2016-02-27 DIAGNOSIS — Z0000 Encounter for general adult medical examination without abnormal findings: Secondary | ICD-10-CM | POA: Diagnosis not present

## 2016-02-27 DIAGNOSIS — Z23 Encounter for immunization: Secondary | ICD-10-CM

## 2016-02-27 DIAGNOSIS — Z1159 Encounter for screening for other viral diseases: Secondary | ICD-10-CM

## 2016-02-27 DIAGNOSIS — Z114 Encounter for screening for human immunodeficiency virus [HIV]: Secondary | ICD-10-CM

## 2016-02-27 DIAGNOSIS — M109 Gout, unspecified: Secondary | ICD-10-CM | POA: Diagnosis not present

## 2016-02-27 LAB — COMPREHENSIVE METABOLIC PANEL
ALT: 34 U/L (ref 0–53)
AST: 22 U/L (ref 0–37)
Albumin: 4.4 g/dL (ref 3.5–5.2)
Alkaline Phosphatase: 66 U/L (ref 39–117)
BUN: 15 mg/dL (ref 6–23)
CHLORIDE: 103 meq/L (ref 96–112)
CO2: 29 meq/L (ref 19–32)
CREATININE: 0.8 mg/dL (ref 0.40–1.50)
Calcium: 9.6 mg/dL (ref 8.4–10.5)
GFR: 107.3 mL/min (ref >60.00)
Glucose, Bld: 98 mg/dL (ref 70–99)
Potassium: 3.7 mEq/L (ref 3.5–5.1)
SODIUM: 142 meq/L (ref 135–145)
Total Bilirubin: 0.8 mg/dL (ref 0.2–1.2)
Total Protein: 7.1 g/dL (ref 6.0–8.3)

## 2016-02-27 LAB — TSH: TSH: 0.92 u[IU]/mL (ref 0.35–4.50)

## 2016-02-27 LAB — CBC WITH DIFFERENTIAL/PLATELET
BASOS PCT: 0.3 % (ref 0.0–3.0)
Basophils Absolute: 0 10*3/uL (ref 0.0–0.1)
EOS ABS: 0.1 10*3/uL (ref 0.0–0.7)
Eosinophils Relative: 1.4 % (ref 0.0–5.0)
HEMATOCRIT: 46.4 % (ref 39.0–52.0)
Hemoglobin: 15.9 g/dL (ref 13.0–17.0)
Lymphocytes Relative: 20.8 % (ref 12.0–46.0)
Lymphs Abs: 1.9 10*3/uL (ref 0.7–4.0)
MCHC: 34.3 g/dL (ref 30.0–36.0)
MCV: 86.4 fl (ref 78.0–100.0)
MONO ABS: 1 10*3/uL (ref 0.1–1.0)
Monocytes Relative: 10.2 % (ref 3.0–12.0)
Neutro Abs: 6.3 10*3/uL (ref 1.4–7.7)
Neutrophils Relative %: 67.3 % (ref 43.0–77.0)
Platelets: 295 10*3/uL (ref 150.0–400.0)
RBC: 5.37 Mil/uL (ref 4.22–5.81)
RDW: 13.7 % (ref 11.5–15.5)
WBC: 9.4 10*3/uL (ref 4.0–10.5)

## 2016-02-27 LAB — LIPID PANEL
Cholesterol: 161 mg/dL (ref 0–200)
HDL: 38 mg/dL — ABNORMAL LOW
LDL Cholesterol: 94 mg/dL (ref 0–99)
NonHDL: 123.07
Total CHOL/HDL Ratio: 4
Triglycerides: 143 mg/dL (ref 0.0–149.0)
VLDL: 28.6 mg/dL (ref 0.0–40.0)

## 2016-02-27 LAB — URIC ACID: Uric Acid, Serum: 8.6 mg/dL — ABNORMAL HIGH (ref 4.0–7.8)

## 2016-02-27 LAB — PSA: PSA: 0.88 ng/mL (ref 0.10–4.00)

## 2016-02-27 NOTE — Patient Instructions (Signed)
GO TO THE LAB : Get the blood work     GO TO THE FRONT DESK Schedule your next appointment for a  routine checkup in 6 months   Started to exercise gradually  Think about calorie counting, MYFITNESSPAL  Call if you like to talk with a nutritionist  Excellent information about a healthy diet   is found at the American heart association website

## 2016-02-27 NOTE — Assessment & Plan Note (Addendum)
Td 2012 , flu shot today  FH Colon cancer, pt reports a Cscope w/ Dr Candelaria StagersMeadoff 2007  and 07-2014, next per GI Prostate ca screening : DRE normal, check PSA  + FH CAD: Asx, plan is to control his CV RF  Labs: Hep C, HIV, uric acid, CMP, FLP, CBC, TSH, HIV, PSA  Diet and exercise discussed

## 2016-02-27 NOTE — Progress Notes (Signed)
Pre visit review using our clinic review tool, if applicable. No additional management support is needed unless otherwise documented below in the visit note. 

## 2016-02-27 NOTE — Progress Notes (Signed)
Subjective:    Patient ID: Danny [name], male    DOB: [birth year], 55 y.o.   MRN: [id number]  DOS:  02/27/2016 Type of visit - description : Complete physical exam Interval history: In general feeling well, no major concerns    Review of Systems  Constitutional: No fever. No chills. No unexplained wt changes. No unusual sweats  HEENT: No dental problems, no ear discharge, no facial swelling, no voice changes. No eye discharge, no eye  redness , no  intolerance to light   Respiratory: No wheezing , no  difficulty breathing. No cough , no mucus production  Cardiovascular: No CP, no leg swelling , no  Palpitations  GI: no nausea, no vomiting, no diarrhea , no  abdominal pain.  No blood in the stools. No dysphagia, no odynophagia    Endocrine: No polyphagia, no polyuria , no polydipsia  GU: No dysuria, gross hematuria, difficulty urinating. No urinary urgency, no frequency.  Musculoskeletal: Good compliance with allopurinol, had episodes of gout around twice a year, uses indomethacin  Skin: No change in the color of the skin, palor , no  Rash  Allergic, immunologic: No environmental allergies , no  food allergies  Neurological: No dizziness no  syncope. No headaches. No diplopia, no slurred, no slurred speech, no motor deficits, no facial  Numbness  Hematological: No enlarged lymph nodes, no easy bruising , no unusual bleedings  Psychiatry: No suicidal ideas, no hallucinations, no beavior problems, no confusion.  No unusual/severe anxiety, no depression   Past Medical History:  Diagnosis Date  . Acute tear medial meniscus 2017  . Effusion, left knee 2017  . Epiploic appendagitis 2007  . Gout   . Internal derangement of knee joint, left 2017  . Synovitis of left knee 2017    Past Surgical History:  Procedure Laterality Date  . KNEE ARTHROSCOPY WITH LATERAL MENISECTOMY Left 04/2015    Social History   Social History  . Marital status: Married    Spouse name:  N/A  . Number of children: 2  . Years of education: N/A   Occupational History  . appartment Marketing executivecommunity manager     Social History Main Topics  . Smoking status: Never Smoker  . Smokeless tobacco: Never Used  . Alcohol use Yes     Comment: socially  . Drug use: No  . Sexual activity: Not on file   Other Topics Concern  . Not on file   Social History Narrative    Lives w/ wife, one child at home     Family History  Problem Relation Age of Onset  . Adopted: Yes  . Colon cancer Mother [id number]  . Hypertension Mother   . Coronary artery disease Father 7450    F MI  . Stroke Father [id number]  . Prostate cancer Neg Hx       Medication List       Accurate as of 02/27/16 11:59 PM. Always use your most recent med list.          allopurinol 100 MG tablet Commonly known as:  ZYLOPRIM Take 2 tablets daily.   aspirin 81 MG tablet Take 81 mg by mouth daily.   indomethacin 25 MG capsule Commonly known as:  INDOCIN Take 25 mg by mouth 2 (two) times daily with a meal.   vitamin C 1000 MG tablet Take 500 mg by mouth.          Objective:   Physical Exam BP (!) 118/96 (BP Location:  Left Arm, Patient Position: Sitting, Cuff Size: Large)   Pulse 90   Temp 97.7 F (36.5 C) (Oral)   Ht 6\' 5"  (1.956 m)   Wt (!) 316 lb (143.3 kg)   SpO2 96%   BMI 37.47 kg/m   General:   Well developed, obese. NAD.  Neck: No  thyromegaly  HEENT:  Normocephalic . Face symmetric, atraumatic Lungs:  CTA B Normal respiratory effort, no intercostal retractions, no accessory muscle use. Heart: RRR,  no murmur.  No pretibial edema bilaterally  Abdomen:  Not distended, soft, non-tender. No rebound or rigidity.   Skin: Exposed areas without rash. Not pale. Not jaundice Rectal:  External abnormalities: none. Normal sphincter tone. No rectal masses or tenderness.  No stools found Prostate: Prostate gland firm and smooth, no enlargement, nodularity, tenderness, mass, asymmetry or induration.    Neurologic:  alert & oriented X3.  Speech normal, gait appropriate for age and unassisted Strength symmetric and appropriate for age.  Psych: Cognition and judgment appear intact.  Cooperative with normal attention span and concentration.  Behavior appropriate. No anxious or depressed appearing.    Assessment & Plan:   Assessment  Gout  H/o Epiploic appendagitis   PLAN: Gout: Currently on allopurinol 200 mg daily and indomethacin (dose?) 1 time a day prn. Prior to allopurinol he had 6 or 7 episodes a year, currently ~ 2 a year. Obesity: Diet and exercise discussed RTC 6 months to check his weight.

## 2016-02-28 LAB — HIV ANTIBODY (ROUTINE TESTING W REFLEX): HIV 1&2 Ab, 4th Generation: NONREACTIVE

## 2016-02-28 LAB — HEPATITIS C ANTIBODY: HCV Ab: NEGATIVE

## 2016-02-29 DIAGNOSIS — Z09 Encounter for follow-up examination after completed treatment for conditions other than malignant neoplasm: Secondary | ICD-10-CM | POA: Insufficient documentation

## 2016-02-29 NOTE — Assessment & Plan Note (Signed)
Gout: Currently on allopurinol 200 mg daily and indomethacin (dose?) 1 time a day prn. Prior to allopurinol he had 6 or 7 episodes a year, currently ~ 2 a year. Obesity: Diet and exercise discussed RTC 6 months to check his weight.

## 2016-03-06 MED ORDER — ALLOPURINOL 300 MG PO TABS: 300.0000 mg | ORAL_TABLET | Freq: Every day | ORAL | 1 refills | Status: AC

## 2016-03-06 MED ORDER — INDOMETHACIN 25 MG PO CAPS: 25.0000 mg | ORAL_CAPSULE | Freq: Three times a day (TID) | ORAL | 0 refills | Status: DC

## 2016-03-06 NOTE — Addendum Note (Signed)
Addended [place]: Anikka Marsan D on: 03/06/2016 04:32 PM   Modules accepted: Orders

## 2016-06-03 ENCOUNTER — Telehealth: Payer: Self-pay | Admitting: Internal Medicine

## 2016-06-03 NOTE — Telephone Encounter (Signed)
Letter printed and mailed to Pt.  

## 2016-06-03 NOTE — Telephone Encounter (Signed)
Advise patient: Few months ago we increased the allopurinol dose,  is important to do blood work to be sure they uric acid is going down and his liver is okay. Please advise patient to come to the office for blood work. If unable to contact him, send a letter

## 2016-08-29 ENCOUNTER — Ambulatory Visit (INDEPENDENT_AMBULATORY_CARE_PROVIDER_SITE_OTHER): Payer: Managed Care, Other (non HMO) | Admitting: Internal Medicine

## 2016-08-29 ENCOUNTER — Encounter: Payer: Self-pay | Admitting: Internal Medicine

## 2016-08-29 VITALS — BP 124/72 | HR 86 | Temp 98.2°F | Resp 14 | Ht 77.0 in | Wt 296.5 lb

## 2016-08-29 DIAGNOSIS — M109 Gout, unspecified: Secondary | ICD-10-CM | POA: Diagnosis not present

## 2016-08-29 DIAGNOSIS — R0683 Snoring: Secondary | ICD-10-CM | POA: Diagnosis not present

## 2016-08-29 DIAGNOSIS — E669 Obesity, unspecified: Secondary | ICD-10-CM

## 2016-08-29 LAB — URIC ACID: URIC ACID, SERUM: 7.6 mg/dL (ref 4.0–7.8)

## 2016-08-29 NOTE — Patient Instructions (Signed)
GO TO THE LAB : Get the blood work     GO TO THE FRONT DESK Schedule your next appointment for a  Physical, October 2018  HEALTHY SLEEP Sleep hygiene: Basic rules for a good night's sleep  Sleep only as much as you need to feel rested and then get out of bed  Keep a regular sleep schedule  Avoid forcing sleep  Exercise regularly for at least 20 minutes, preferably 4 to 5 hours before bedtime  Avoid caffeinated beverages after lunch  Avoid alcohol near bedtime: no "night cap"  Avoid smoking, especially in the evening  Do not go to bed hungry  Adjust bedroom environment  Avoid prolonged use of light-emitting screens before bedtime   Deal with your worries before bedtime

## 2016-08-29 NOTE — Progress Notes (Signed)
Subjective:    Patient ID: Danny RowanJeffrey T [name], male    DOB: 08/27/1960, 56 y.o.   MRN: [id number]  DOS:  08/29/2016 Type of visit - description : Follow-up Interval history: Obesity: Since the last office visit he change his diet, less carbohydrates, more fruits and vegetables. Has lost 19 pounds per his scales. Gout: No episodes Would like to have a sleep study, states that his sleep is broken, he snores; admit to lack of energy and falling asleep very easily at the end of the day but not during the day.  Wt Readings from Last 3 Encounters:  08/29/16 296 lb 8 oz (134.5 kg)  02/27/16 (!) 316 lb (143.3 kg)  06/28/14 (!) 309 lb 8 oz (140.4 kg)     Review of Systems   In general feeling great, before had knee pains, they have improved.  Past Medical History:  Diagnosis Date  . Acute tear medial meniscus 2017  . Effusion, left knee 2017  . Epiploic appendagitis 2007  . Gout   . Internal derangement of knee joint, left 2017  . Synovitis of left knee 2017    Past Surgical History:  Procedure Laterality Date  . KNEE ARTHROSCOPY WITH LATERAL MENISECTOMY Left 04/2015    Social History   Social History  . Marital status: Married    Spouse name: N/A  . Number of children: 2  . Years of education: N/A   Occupational History  . appartment Marketing executivecommunity manager     Social History Main Topics  . Smoking status: Never Smoker  . Smokeless tobacco: Never Used  . Alcohol use Yes     Comment: socially  . Drug use: No  . Sexual activity: Not on file   Other Topics Concern  . Not on file   Social History Narrative    Lives w/ wife, one child at home      Allergies as of 08/29/2016      Reactions   Penicillins    Other reaction(s): Other (See Comments) CHILDHOOD REACTION      Medication List       Accurate as of 08/29/16  2:19 PM. Always use your most recent med list.          allopurinol 300 MG tablet Commonly known as:  ZYLOPRIM Take 1 tablet (300 mg total) by mouth  daily.   aspirin 81 MG tablet Take 81 mg by mouth daily.   indomethacin 25 MG capsule Commonly known as:  INDOCIN Take 1 capsule (25 mg total) by mouth 3 (three) times daily with meals.   vitamin C 1000 MG tablet Take 500 mg by mouth.          Objective:   Physical Exam BP 124/72 (BP Location: Left Arm, Patient Position: Sitting, Cuff Size: Normal)   Pulse 86   Temp 98.2 F (36.8 C) (Oral)   Resp 14   Ht 6\' 5"  (1.956 m)   Wt 296 lb 8 oz (134.5 kg)   SpO2 98%   BMI 35.16 kg/m  General:   Well developed, well nourished . NAD.  HEENT:  Normocephalic . Face symmetric, atraumatic  Skin: Not pale. Not jaundice Neurologic:  alert & oriented X3.  Speech normal, gait appropriate for age and unassisted Psych--  Cognition and judgment appear intact.  Cooperative with normal attention span and concentration.  Behavior appropriate. No anxious or depressed appearing.      Assessment & Plan:   Assessment  Gout  H/o Epiploic appendagitis  PLAN: Obesity: Doing great with diet, + wt loss, plans to start an exercise program. Encouraged to continue in the same Gout:  check a uric acid, considered decrease to allopurinol to 200 mg daily, if we do he will take Indocin daily to prevent the an episode Snoring: Reports snoring-poor sleep and request a sleep study, he is not particularly sleepy throughout the day but will go ahead and refer to  to see if he qualifies for a sleep study. Good sleep habits discussed, that may be enough to take care of the problem. RTC 6 months, CPX

## 2016-08-29 NOTE — Assessment & Plan Note (Signed)
Obesity: Doing great with diet, + wt loss, plans to start an exercise program. Encouraged to continue in the same Gout:  check a uric acid, considered decrease to allopurinol to 200 mg daily, if we do he will take Indocin daily to prevent the an episode Snoring: Reports snoring-poor sleep and request a sleep study, he is not particularly sleepy throughout the day but will go ahead and refer to  to see if he qualifies for a sleep study. Good sleep habits discussed, that may be enough to take care of the problem. RTC 6 months, CPX

## 2016-08-29 NOTE — Progress Notes (Signed)
Pre visit review using our clinic review tool, if applicable. No additional management support is needed unless otherwise documented below in the visit note. 

## 2016-10-02 ENCOUNTER — Encounter: Payer: Self-pay | Admitting: Neurology

## 2016-10-02 ENCOUNTER — Encounter (INDEPENDENT_AMBULATORY_CARE_PROVIDER_SITE_OTHER): Payer: Self-pay

## 2016-10-02 ENCOUNTER — Ambulatory Visit (INDEPENDENT_AMBULATORY_CARE_PROVIDER_SITE_OTHER): Payer: Managed Care, Other (non HMO) | Admitting: Neurology

## 2016-10-02 VITALS — BP 130/76 | HR 75 | Resp 20 | Ht 77.0 in | Wt 295.0 lb

## 2016-10-02 DIAGNOSIS — G4730 Sleep apnea, unspecified: Secondary | ICD-10-CM | POA: Diagnosis not present

## 2016-10-02 DIAGNOSIS — R0683 Snoring: Secondary | ICD-10-CM

## 2016-10-02 DIAGNOSIS — M1000 Idiopathic gout, unspecified site: Secondary | ICD-10-CM | POA: Diagnosis not present

## 2016-10-02 DIAGNOSIS — G4710 Hypersomnia, unspecified: Secondary | ICD-10-CM

## 2016-10-02 NOTE — Progress Notes (Signed)
SLEEP MEDICINE CLINIC   Provider:  Melvyn Novasarmen  Coleson Kant, M D  Primary Care Physician:  Wanda PlumpPaz, Jose E, MD   Referring Provider: Wanda PlumpPaz, Jose E, MD    Chief Complaint  Patient presents with  . New Patient (Initial Visit)    snoring, never had a sleep study    HPI:  Danny [name] is a 56 y.o. male , seen here as in a referral/ revisit  from Dr. Drue NovelPaz for insomnia and snoring.   Chief complaint according to patient : " I can't sleep through the night"   Danny [name] presents with a chief complaint of not getting qualitatively his sleep. He feels that his sleep is not as restorative and refreshing and is fragmented each night. His problem is not to fall asleep in the first place but to stay asleep. His medical problems include morbid obesity, gout, left knee effusion after a meniscus tear, given the process of losing weight by diet, cut out disorder, drinks very little call and avoids processed foods. He has lost 19 pounds it also has helped his gout and knee pain.  Sleep habits are as follows: patient reports a bedtime of 11 PM and his bedroom is described as cool, quiet and dark.  He shares his bedroom with his wife ,who snores loudly, too. He sleeps on his side, on 2 pillows. He does sleep with an elevated head.  NO acid reflux, one bathroom break. He transfers often to the bonus room and a recliner after the bathroom break, he watches TV after 3 or 4 AM . No external triggers identified.  Wakes with a dry mouth.The patient reports that he sometimes wakes up choking, with the feeling of a blocked airway and his family has reported that he snores loudly. He may breezes irregularly at night.  Sleep medical history and family sleep history:  No known family history of sleep apnea,  Childhood sleep walker and - talker,  Enuresis. His youngest son was a Corporate investment bankerbedwetter, too.   Social history: regular day time work, office based, no natural day light access. He works as a Investment banker, corporateproperty manager. Non smoker, ETOH- beer  3-5 a week. Caffeine 1-2 cups of coffee a day/ quit sodas recently , Married, 2 sons, both grown.   Review of Systems: Out of a complete 14 system review, the patient complains of only the following symptoms, and all other reviewed systems are negative. Snoring , weight gain.   Epworth score 8 , Fatigue severity score 39  , depression score n/a    Social History   Social History  . Marital status: Married    Spouse name: N/A  . Number of children: 2  . Years of education: N/A   Occupational History  . appartment Marketing executivecommunity manager     Social History Main Topics  . Smoking status: Never Smoker  . Smokeless tobacco: Never Used  . Alcohol use Yes     Comment: socially  . Drug use: No  . Sexual activity: Not on file   Other Topics Concern  . Not on file   Social History Narrative    Lives w/ wife, one child at home    Family History  Problem Relation Age of Onset  . Adopted: Yes  . Colon cancer Mother 6160  . Hypertension Mother   . Coronary artery disease Father 3050       F MI  . Stroke Father 4865  . Prostate cancer Neg Hx     Past Medical  History:  Diagnosis Date  . Acute tear medial meniscus 2017  . Effusion, left knee 2017  . Epiploic appendagitis 2007  . Gout   . Internal derangement of knee joint, left 2017  . Synovitis of left knee 2017    Past Surgical History:  Procedure Laterality Date  . KNEE ARTHROSCOPY WITH LATERAL MENISECTOMY Left 04/2015    Current Outpatient Prescriptions  Medication Sig Dispense Refill  . allopurinol (ZYLOPRIM) 300 MG tablet Take 1 tablet (300 mg total) by mouth daily. 30 tablet 1  . Ascorbic Acid (VITAMIN C) 1000 MG tablet Take 500 mg by mouth.    Marland Kitchen aspirin 81 MG tablet Take 81 mg by mouth daily.      . indomethacin (INDOCIN) 25 MG capsule Take 1 capsule (25 mg total) by mouth 3 (three) times daily with meals. 30 capsule 0   No current facility-administered medications for this visit.     Allergies as of 10/02/2016 -  Review Complete 10/02/2016  Allergen Reaction Noted  . Penicillins  12/21/2007    Vitals: BP 130/76   Pulse 75   Resp 20   Ht 6\' 5"  (1.956 m)   Wt 295 lb (133.8 kg)   BMI 34.98 kg/m  Last Weight:  Wt Readings from Last 1 Encounters:  10/02/16 295 lb (133.8 kg)   AVW:UJWJ mass index is 34.98 kg/m.     Last Height:   Ht Readings from Last 1 Encounters:  10/02/16 6\' 5"  (1.956 m)    Physical exam:  General: The patient is awake, alert and appears not in acute distress. The patient is well groomed. Head: Normocephalic, atraumatic. Neck is supple. Mallampati 4,  neck circumference 19.5  Nasal airflow patent , no TMJ click.  Cardiovascular:  Regular rate and rhythm, without  murmurs or carotid bruit, and without distended neck veins. Respiratory: Lungs are clear to auscultation. Skin:  Without evidence of edema, or rash Trunk: BMI is 35. The patient's posture is erect   Neurologic exam : The patient is awake and alert, oriented to place and time.   Memory subjective  described as intact.     Attention span & concentration ability appears normal.  Speech is fluent,  without  dysarthria, dysphonia or aphasia.  Mood and affect are appropriate.  Cranial nerves: Pupils are equal and briskly reactive to light. Extraocular movements  in vertical and horizontal planes intact and without nystagmus. Visual fields by finger perimetry are intact. Hearing to finger rub intact.  Facial sensation intact to fine touch. Facial motor strength is symmetric and tongue and uvula move midline. Shoulder shrug was symmetrical.   Motor exam:   Normal tone, muscle bulk and symmetric strength in all extremities.  Sensory:  Fine touch, pinprick and vibration were tested in all extremities.   Coordination: Rapid alternating movements/Finger-to-nose maneuver  normal without evidence of ataxia, dysmetria or tremor.  Gait and station: Patient walks without assistive device and is able unassisted to climb  up to the exam table. Strength within normal limits.  Stance is stable and normal.   Deep tendon reflexes: in the  upper and lower extremities are symmetric and intact.    Assessment:  After physical and neurologic examination, review of laboratory studies,  Personal review of imaging studies, reports of other /same  Imaging studies, results of polysomnography and / or neurophysiology testing and pre-existing records as far as provided in visit., my assessment is   1) Danny [name] has been witnessed to snore, he has experienced  sleep choking, and likely apneas. And he has developed daytime sleepiness and fatigue. Based on his description I have no doubt that we have to rule out obstructive sleep apnea for him, and the easiest test would be a home sleep test for him. He has experienced interruptions in sleep, sleep fragmentation in the early morning hours. I'm not sure that these are necessarily related to apnea, sometimes these are symptoms of anxiety or depression, in other cases related to pain but he has not experienced pain at night.   The patient was advised of the nature of the diagnosed disorder , the treatment options and the  risks for general health and wellness arising from not treating the condition.   I spent more than 45 minutes of face to face time with the patient.  Greater than 50% of time was spent in counseling and coordination of care. We have discussed the diagnosis and differential and I answered the patient's questions.    Plan:  Treatment plan and additional workup : I will evaluate him with a home sleep test to see if apnea is present and to what degree, it is associated with cardiac arrhythmia or prolonged oxygen desaturation time. Based on the results of the home sleep test we will decide how to treat him. I encouraged him to continue his weight loss regimen and the diet which seems to have had achieved reduction in inflammation.  Should the patient be a snorer without  associated apnea I would be happy to refer him for a dental device rather than CPAP.    Melvyn Novas, MD 10/02/2016, 1:02 PM  Certified in Neurology by ABPN Certified in Sleep Medicine by [facility] [address] [place], [place] [address]

## 2016-10-28 ENCOUNTER — Ambulatory Visit (INDEPENDENT_AMBULATORY_CARE_PROVIDER_SITE_OTHER): Payer: Managed Care, Other (non HMO) | Admitting: Neurology

## 2016-10-28 DIAGNOSIS — M1000 Idiopathic gout, unspecified site: Secondary | ICD-10-CM

## 2016-10-28 DIAGNOSIS — G4730 Sleep apnea, unspecified: Secondary | ICD-10-CM

## 2016-10-28 DIAGNOSIS — R0683 Snoring: Secondary | ICD-10-CM

## 2016-10-28 DIAGNOSIS — G4710 Hypersomnia, unspecified: Secondary | ICD-10-CM | POA: Diagnosis not present

## 2016-11-04 NOTE — Addendum Note (Signed)
Addended by: Melvyn NovasHMEIER, Girtha Kilgore on: 11/04/2016 06:20 PM   Modules accepted: Orders

## 2016-11-04 NOTE — Procedures (Signed)
NAME: Danny PatersonJeffrey [name]   DOB: April 14, 1961 MEDICAL RECORD [id number]   DOS: 10/28/2016 REFERRING PHYSICIAN: Willow OraJose Paz, MD STUDY PERFORMED: HST HISTORY: [name] is a 56 year old male patient, seen here for insomnia and snoring.   Chief complaint according to patient: "I can't sleep through the night"    Mr. [name] presents with a chief complaint of not getting quality or sufficient sleep. He feels that his sleep is fragmented each night. His problem is not to fall asleep in the first place but to stay asleep. His g family noted him to snore and he woke at times with choking sensation.   His medical problems include morbid obesity, gout, HTN. He is in process of losing weight by diet.  Epworth Sleepiness score 8 points, Fatigue severity score 39 points.   STUDY RESULTS:  Total Recording Time: 7h 3873m. Total Apnea/Hypopnea Index (AHI): 7.0/ hr. RDI was 9.8/hr. Average Oxygen Saturation:  SpO2 93%. Lowest Oxygen Saturation: 86 %. Total desaturation time at or below 88% was 3 minutes. Average Heart Rate: 61 bpm (48-96 bpm )  IMPRESSION: This mild degree of apnea can be treated with CPAP or a dental device, or may be alleviated by further weight loss.  RECOMMENDATION:  I would place Mr. Grogan on an auto-titration device 5-15 cm water pressure until he has reached a lower BMI ( < 30). This will treat AHI and snoring. I will oblige his wishes if he prefers a dental device.   I certify that I have reviewed the raw data recording prior to the issuance of this report in accordance with the standards of the American Academy of Sleep Medicine (AASM).  Melvyn Novasarmen Mashelle Busick, MD    11-04-2016  [facility] Medical Director Diplomat, ABPN and of ABSM, Member of the AASM

## 2016-11-06 ENCOUNTER — Telehealth: Payer: Self-pay | Admitting: Neurology

## 2016-11-06 NOTE — Telephone Encounter (Signed)
Called pt to discuss sleep study results. Discussed with pt the mild degree of sleep apnea that he has. I expressed that Dr Vickey Hugerohmeier has recommended CPAP, Dental device or further weight loss they may help. I told him that she was giving him the option as to which to try first and explained that in getting a dental device he would need a referral to a dentist. He would like to think about this at this time. Will await his decision and call back on what he would like to do

## 2016-11-06 NOTE — Telephone Encounter (Signed)
-----   Message from Melvyn Novasarmen Dohmeier, MD sent at 11/04/2016  6:20 PM EDT ----- PLEASE CALL -  IMPRESSION: This mild degree of apnea can be treated with CPAP or  a dental device, or may be alleviated by further weight loss.  RECOMMENDATION: I would place Danny [name] on an auto-titration  device 5-15 cm water pressure until he has reached a lower BMI (  < 30). This will treat AHI and snoring. I will oblige his wishes if he prefers a dental device.

## 2017-02-05 ENCOUNTER — Telehealth: Payer: Self-pay | Admitting: Neurology

## 2017-02-05 NOTE — Telephone Encounter (Signed)
Pt is calling to discuss options for sleep. Please look at tele note from 11/06/16. Thank you

## 2017-02-05 NOTE — Telephone Encounter (Signed)
Pt has decided that he would like to go the dental device route for treatment of his apnea. The patient has an apt with his dentist tomorrow and he will follow up with the dentist at this time to find out if they offer the dental device and if so what is needed from [place]. If they need a referral we can send that over. Pt will call back with that information.

## 2017-03-06 ENCOUNTER — Telehealth: Payer: Self-pay | Admitting: Neurology

## 2017-03-06 DIAGNOSIS — G4730 Sleep apnea, unspecified: Secondary | ICD-10-CM

## 2017-03-06 DIAGNOSIS — R0683 Snoring: Secondary | ICD-10-CM

## 2017-03-06 NOTE — Addendum Note (Signed)
Addended by: Marlana LatusBRUNO, Pearl Berlinger C on: 03/06/2017 10:30 AM   Modules accepted: Orders

## 2017-03-06 NOTE — Telephone Encounter (Signed)
Patient has called back and stated that his dentist does not offer the dental device to help with treatment of sleep apnea and snoring. He has asked that we place a referral for the patient to get this taken care of. I informed him the order would be placed and that someone from our referrals dept will reach out to him. Pt verbalized understanding. Pt had no questions at this time but was encouraged to call back if questions arise.

## 2017-03-06 NOTE — Telephone Encounter (Signed)
Pt is asking for a call back from RN Baird LyonsCasey, please call him on his mobile#

## 2017-08-20 ENCOUNTER — Encounter: Payer: Self-pay | Admitting: Physician Assistant

## 2017-08-20 ENCOUNTER — Other Ambulatory Visit: Payer: Self-pay

## 2017-08-20 ENCOUNTER — Ambulatory Visit: Payer: Managed Care, Other (non HMO) | Admitting: Physician Assistant

## 2017-08-20 VITALS — BP 118/76 | HR 98 | Temp 98.5°F | Resp 16 | Ht 77.0 in | Wt 288.6 lb

## 2017-08-20 DIAGNOSIS — H6123 Impacted cerumen, bilateral: Secondary | ICD-10-CM

## 2017-08-20 NOTE — Patient Instructions (Addendum)
  Please do not use Q-tips, as this will further impact the ear wax in your ear.   If you have cerumen impaction more than once a year you can reduce the occurrences by using a cotton ball dipped in mineral oil and place it in the external canal for 10-20 minutes once a week (combined with eight hours of not using a hearing aid overnight, if applicable). This helps liquify cerumen and aid in the normal elimination mechanisms.    You can also try a liquid stool softener. It works by dissolving or loosening the earwax, allowing it to be more easily removed upon irrigation. Lie on your side with the affected ear facing up and instill warmed (not hot) 1 mL (about 15 drops) of liquid Colace into the ear. Let the liquid sit in the ear for 10-15 minutes so it can work. After 15 minutes, rinse the affected ear with warm water so that you can get the softened earwax out of your canal.   Routine cleaning of ears by a health professional every 6-12 months is recommended.   If you have itchy ears, use sweet oil. If you need more relief use a small amount of hydrocortisone ointment.   Thank you for coming in today. I hope you feel we met your needs.  Feel free to call PCP if you have any questions or further requests.  Please consider signing up for MyChart if you do not already have it, as this is a great way to communicate with me.  Best,  Whitney McVey, PA-C   IF you received an x-ray today, you will receive an invoice from [facility]. Please contact [facility] at [phone] with questions or concerns regarding your invoice.   IF you received labwork today, you will receive an invoice from [facility]. Please contact [facility] at [phone] with questions or concerns regarding your invoice.   Our billing staff will not be able to assist you with questions regarding bills from these companies.  You will be contacted with the lab results as soon as they are available. The fastest way  to get your results is to activate your My Chart account. Instructions are located on the last page of this paperwork. If you have not heard from us regarding the results in 2 weeks, please contact this office.     

## 2017-08-20 NOTE — Progress Notes (Signed)
   Danny RowanJeffrey T [name]  MRN: [id number] DOB: [birth year]  PCP: Wanda PlumpPaz, Jose E, MD  Subjective:  Pt is a 57 year old male who presents to clinic for right ear discomfort x 2 days. Describes a "roaring" sensation. Endorses some hearing loss. Denies pain, drainage, tinnitus, balance problems, headache, jaw pain.  He has had a problem with ear wax build-up in the past and had to have ears cleaned. He has not tried anything to feel better.    has a past medical history of Acute tear medial meniscus (2017), Effusion, left knee (2017), Epiploic appendagitis (2007), Gout, Internal derangement of knee joint, left (2017), and Synovitis of left knee (2017).  Review of Systems  Constitutional: Negative for diaphoresis and fatigue.  HENT: Positive for hearing loss. Negative for ear discharge, ear pain and tinnitus.   Neurological: Negative for dizziness and headaches.    Patient Active Problem List   Diagnosis Date Noted  . Obesity (BMI 35.0-39.9 without comorbidity) 08/29/2016  . PCP NOTES >>>>>>>>>>>>>>>>>>. 02/29/2016  . General medical examination 09/19/2010  . GOUT 12/21/2007    Current Outpatient Medications on File Prior to Visit  Medication Sig Dispense Refill  . allopurinol (ZYLOPRIM) 300 MG tablet Take 1 tablet (300 mg total) by mouth daily. 30 tablet 1  . Ascorbic Acid (VITAMIN C) 1000 MG tablet Take 500 mg by mouth.    . indomethacin (INDOCIN) 25 MG capsule Take 1 capsule (25 mg total) by mouth 3 (three) times daily with meals. 30 capsule 0  . aspirin 81 MG tablet Take 81 mg by mouth daily.       No current facility-administered medications on file prior to visit.     Allergies  Allergen Reactions  . Penicillins     Other reaction(s): Other (See Comments) CHILDHOOD REACTION     Objective:  BP 118/76   Pulse 98   Temp 98.5 F (36.9 C) (Oral)   Resp 16   Ht 6\' 5"  (1.956 m)   Wt 288 lb 9.6 oz (130.9 kg)   SpO2 95%   BMI 34.22 kg/m   Physical Exam  Constitutional: He appears  well-developed and well-nourished.  HENT:  Right Ear: No tenderness. No foreign bodies. No mastoid tenderness. Tympanic membrane is not bulging.  Left Ear: No tenderness. No foreign bodies. No mastoid tenderness. Tympanic membrane is not bulging.  B/l cerumen impaction, TM's not visualized.  After ear lavage, b/l TM's visualized and are pearly gray with minor injection.   Skin: Skin is warm and dry.  Psychiatric: He has a normal mood and affect. His behavior is normal. Judgment and thought content normal.    Assessment and Plan :  1. Bilateral impacted cerumen - Ear wax removal - Ear lavage performed successfully by CMA. TM's lookk great. Pt con't to hear/feel "whooshing". Advised NSAID's x 24-48 hours. RTC if no improvement. Consider ENT referral.   Marco CollieWhitney Terran Klinke, PA-C  Primary [facility] at [facility] 08/20/2017 9:29 AM

## 2018-03-11 ENCOUNTER — Ambulatory Visit (INDEPENDENT_AMBULATORY_CARE_PROVIDER_SITE_OTHER): Payer: Managed Care, Other (non HMO) | Admitting: Internal Medicine

## 2018-03-11 ENCOUNTER — Encounter: Payer: Self-pay | Admitting: Internal Medicine

## 2018-03-11 VITALS — BP 128/80 | HR 83 | Temp 98.0°F | Resp 16 | Ht 77.0 in | Wt 299.0 lb

## 2018-03-11 DIAGNOSIS — M109 Gout, unspecified: Secondary | ICD-10-CM | POA: Diagnosis not present

## 2018-03-11 DIAGNOSIS — E669 Obesity, unspecified: Secondary | ICD-10-CM

## 2018-03-11 DIAGNOSIS — Z0000 Encounter for general adult medical examination without abnormal findings: Secondary | ICD-10-CM

## 2018-03-11 NOTE — Patient Instructions (Addendum)
GO TO THE LAB : Get the blood work     GO TO THE FRONT DESK Schedule your next appointment for a a physical exam in 1 year  Please consider proceed with a dental appliance  Consider visit the healthy weight and wellness center  Get  shoe insert for arch support.  We could refer you to Dr. Katrinka BlazingSmith if needed

## 2018-03-11 NOTE — Progress Notes (Signed)
Pre visit review using our clinic review tool, if applicable. No additional management support is needed unless otherwise documented below in the visit note. 

## 2018-03-11 NOTE — Assessment & Plan Note (Signed)
Td 2012, s/p  flu shot.  Zostavax 2014.  Shingrix discussed - FH Colon cancer, pt reports a Cscope w/ Dr Candelaria StagersMeadoff 2007  and 07-2014, next per GI --Prostate ca screening : DRE normal, check PSA - (+) FH CAD: Asx, plan is to control his CV RF, pro/cons of ASA discussed, cont ASA - Labs: CMP, FLP, CBC, TSH, PSA, uric acid -Diet and exercise discussed, recommend to think about the weight management clinic.

## 2018-03-11 NOTE — Progress Notes (Signed)
Subjective:    Patient ID: Danny [name], male    DOB: [birth year], 57 y.o.   MRN: [id number]  DOS:  03/11/2018 Type of visit - description : cpx Interval history: Since the last office visit, was diagnosed with sleep apnea, chart reviewed. Doing great with diet and exercise, lost 40 pounds per his own scales, now weight is coming back. Overall after he lost weight he felt great Denies feeling sleepy or fatigued. Occasionally has feet pain, consistent with metatarsalgia.  No actual gout.  Wt Readings from Last 3 Encounters:  03/11/18 299 lb (135.6 kg)  08/20/17 288 lb 9.6 oz (130.9 kg)  10/02/16 295 lb (133.8 kg)    Review of Systems  Other than above, a 14 point review of systems is negative     Past Medical History:  Diagnosis Date  . Acute tear medial meniscus 2017  . Effusion, left knee 2017  . Epiploic appendagitis 2007  . Gout   . Internal derangement of knee joint, left 2017  . Synovitis of left knee 2017    Past Surgical History:  Procedure Laterality Date  . KNEE ARTHROSCOPY WITH LATERAL MENISECTOMY Left 04/2015    Social History   Socioeconomic History  . Marital status: Married    Spouse name: Not on file  . Number of children: 2  . Years of education: Not on file  . Highest education level: Not on file  Occupational History  . Occupation: Dietitian   Social Needs  . Financial resource strain: Not on file  . Food insecurity:    Worry: Not on file    Inability: Not on file  . Transportation needs:    Medical: Not on file    Non-medical: Not on file  Tobacco Use  . Smoking status: Never Smoker  . Smokeless tobacco: Never Used  Substance and Sexual Activity  . Alcohol use: Yes    Alcohol/week: 1.0 - 2.0 standard drinks    Types: 1 - 2 Standard drinks or equivalent per week    Comment: socially  . Drug use: No  . Sexual activity: Not on file  Lifestyle  . Physical activity:    Days per week: Not on file    Minutes per  session: Not on file  . Stress: Not on file  Relationships  . Social connections:    Talks on phone: Not on file    Gets together: Not on file    Attends religious service: Not on file    Active member of club or organization: Not on file    Attends meetings of clubs or organizations: Not on file    Relationship status: Not on file  . Intimate partner violence:    Fear of current or ex partner: Not on file    Emotionally abused: Not on file    Physically abused: Not on file    Forced sexual activity: Not on file  Other Topics Concern  . Not on file  Social History Narrative    Lives w/ wife     Family History  Adopted: Yes  Problem Relation Age of Onset  . Colon cancer Mother 6  . Hypertension Mother   . Coronary artery disease Father 23       F MI  . Stroke Father 71  . Prostate cancer Neg Hx      Allergies as of 03/11/2018      Reactions   Penicillins    Other reaction(s): Other (See Comments)  CHILDHOOD REACTION      Medication List        Accurate as of 03/11/18 11:59 PM. Always use your most recent med list.          allopurinol 300 MG tablet Commonly known as:  ZYLOPRIM Take 1 tablet (300 mg total) by mouth daily.   aspirin 81 MG tablet Take 81 mg by mouth daily.   indomethacin 25 MG capsule Commonly known as:  INDOCIN Take 1 capsule (25 mg total) by mouth 3 (three) times daily with meals.   Turmeric 500 MG Tabs Take by mouth.   vitamin C 1000 MG tablet Take 500 mg by mouth.          Objective:   Physical Exam BP 128/80 (BP Location: Left Arm, Patient Position: Sitting, Cuff Size: Normal)   Pulse 83   Temp 98 F (36.7 C) (Oral)   Resp 16   Ht 6\' 5"  (1.956 m)   Wt 299 lb (135.6 kg)   SpO2 98%   BMI 35.46 kg/m  General: Well developed, NAD, BMI noted Neck: No  thyromegaly  HEENT:  Normocephalic . Face symmetric, atraumatic Lungs:  CTA B Normal respiratory effort, no intercostal retractions, no accessory muscle use. Heart:  RRR,  no murmur.  No pretibial edema bilaterally  Abdomen:  Not distended, soft, non-tender. No rebound or rigidity.   Skin: Exposed areas without rash. Not pale. Not jaundice Rectal: External abnormalities: none. Normal sphincter tone. No rectal masses or tenderness.  Brown stools Prostate: Prostate gland firm and smooth, no enlargement, nodularity, tenderness, mass, asymmetry or induration MSK: High arch noted in both feet Neurologic:  alert & oriented X3.  Speech normal, gait appropriate for age and unassisted Strength symmetric and appropriate for age.  Psych: Cognition and judgment appear intact.  Cooperative with normal attention span and concentration.  Behavior appropriate. No anxious or depressed appearing.     Assessment & Plan:   Assessment  Gout  H/o Epiploic appendagitis  OSA per sleep study 10/2016, mild rx cpap or dental devise   PLAN: Gout: Continue on allopurinol and Indocin as needed, no frequent episodes.  Checking labs OSA: Diagnosed last year, was recommended either a CPAP or a dental device, has not pursue treatment.  He lost some weight and felt great. Encouraged to get at least a dental appliance and continue working on weight loss. Metatarsalgia: Recommend arch support, see AVS RTC 1 year

## 2018-03-12 LAB — CBC WITH DIFFERENTIAL/PLATELET
BASOS ABS: 0.1 10*3/uL (ref 0.0–0.1)
Basophils Relative: 0.7 % (ref 0.0–3.0)
EOS PCT: 1.5 % (ref 0.0–5.0)
Eosinophils Absolute: 0.1 10*3/uL (ref 0.0–0.7)
HEMATOCRIT: 46.1 % (ref 39.0–52.0)
Hemoglobin: 15.8 g/dL (ref 13.0–17.0)
LYMPHS ABS: 1.9 10*3/uL (ref 0.7–4.0)
LYMPHS PCT: 21.8 % (ref 12.0–46.0)
MCHC: 34.2 g/dL (ref 30.0–36.0)
MCV: 87.7 fl (ref 78.0–100.0)
MONOS PCT: 10.7 % (ref 3.0–12.0)
Monocytes Absolute: 1 10*3/uL (ref 0.1–1.0)
NEUTROS PCT: 65.3 % (ref 43.0–77.0)
Neutro Abs: 5.8 10*3/uL (ref 1.4–7.7)
Platelets: 301 10*3/uL (ref 150.0–400.0)
RBC: 5.25 Mil/uL (ref 4.22–5.81)
RDW: 13.8 % (ref 11.5–15.5)
WBC: 8.9 10*3/uL (ref 4.0–10.5)

## 2018-03-12 LAB — COMPREHENSIVE METABOLIC PANEL
ALK PHOS: 63 U/L (ref 39–117)
ALT: 32 U/L (ref 0–53)
AST: 23 U/L (ref 0–37)
Albumin: 4.5 g/dL (ref 3.5–5.2)
BILIRUBIN TOTAL: 0.8 mg/dL (ref 0.2–1.2)
BUN: 13 mg/dL (ref 6–23)
CALCIUM: 9.5 mg/dL (ref 8.4–10.5)
CO2: 31 mEq/L (ref 19–32)
CREATININE: 0.77 mg/dL (ref 0.40–1.50)
Chloride: 100 mEq/L (ref 96–112)
GFR: 111.29 mL/min (ref >60.00)
Glucose, Bld: 87 mg/dL (ref 70–99)
Potassium: 3.8 mEq/L (ref 3.5–5.1)
Sodium: 140 mEq/L (ref 135–145)
TOTAL PROTEIN: 7.3 g/dL (ref 6.0–8.3)

## 2018-03-12 LAB — LIPID PANEL
Cholesterol: 163 mg/dL (ref 0–200)
HDL: 37.5 mg/dL — AB (ref >39.00)
LDL Cholesterol: 97 mg/dL (ref 0–99)
NONHDL: 125.97
Total CHOL/HDL Ratio: 4
Triglycerides: 144 mg/dL (ref 0.0–149.0)
VLDL: 28.8 mg/dL (ref 0.0–40.0)

## 2018-03-12 LAB — TSH: TSH: 0.94 u[IU]/mL (ref 0.35–4.50)

## 2018-03-12 LAB — URIC ACID: Uric Acid, Serum: 8.1 mg/dL — ABNORMAL HIGH (ref 4.0–7.8)

## 2018-03-12 LAB — PSA: PSA: 0.85 ng/mL (ref 0.10–4.00)

## 2018-03-12 NOTE — Assessment & Plan Note (Signed)
Gout: Continue on allopurinol and Indocin as needed, no frequent episodes.  Checking labs OSA: Diagnosed last year, was recommended either a CPAP or a dental device, has not pursue treatment.  He lost some weight and felt great. Encouraged to get at least a dental appliance and continue working on weight loss. Metatarsalgia: Recommend arch support, see AVS RTC 1 year

## 2019-03-22 ENCOUNTER — Other Ambulatory Visit: Payer: Self-pay

## 2019-03-23 ENCOUNTER — Other Ambulatory Visit: Payer: Self-pay

## 2019-03-23 ENCOUNTER — Encounter: Payer: Self-pay | Admitting: Internal Medicine

## 2019-03-23 ENCOUNTER — Ambulatory Visit (INDEPENDENT_AMBULATORY_CARE_PROVIDER_SITE_OTHER): Payer: Managed Care, Other (non HMO) | Admitting: Internal Medicine

## 2019-03-23 VITALS — BP 124/76 | HR 88 | Temp 96.4°F | Resp 16 | Ht 77.0 in | Wt 295.5 lb

## 2019-03-23 DIAGNOSIS — Z0000 Encounter for general adult medical examination without abnormal findings: Secondary | ICD-10-CM

## 2019-03-23 DIAGNOSIS — Z8249 Family history of ischemic heart disease and other diseases of the circulatory system: Secondary | ICD-10-CM | POA: Diagnosis not present

## 2019-03-23 DIAGNOSIS — M109 Gout, unspecified: Secondary | ICD-10-CM | POA: Diagnosis not present

## 2019-03-23 DIAGNOSIS — Z23 Encounter for immunization: Secondary | ICD-10-CM

## 2019-03-23 LAB — COMPREHENSIVE METABOLIC PANEL
ALT: 34 U/L (ref 0–53)
AST: 21 U/L (ref 0–37)
Albumin: 4 g/dL (ref 3.5–5.2)
Alkaline Phosphatase: 67 U/L (ref 39–117)
BUN: 12 mg/dL (ref 6–23)
CO2: 29 mEq/L (ref 19–32)
Calcium: 9 mg/dL (ref 8.4–10.5)
Chloride: 103 mEq/L (ref 96–112)
Creatinine, Ser: 0.75 mg/dL (ref 0.40–1.50)
GFR: 107.54 mL/min (ref >60.00)
Glucose, Bld: 95 mg/dL (ref 70–99)
Potassium: 3.9 mEq/L (ref 3.5–5.1)
Sodium: 140 mEq/L (ref 135–145)
Total Bilirubin: 0.8 mg/dL (ref 0.2–1.2)
Total Protein: 6.8 g/dL (ref 6.0–8.3)

## 2019-03-23 LAB — CBC WITH DIFFERENTIAL/PLATELET
Basophils Absolute: 0 10*3/uL (ref 0.0–0.1)
Basophils Relative: 0.6 % (ref 0.0–3.0)
Eosinophils Absolute: 0.2 10*3/uL (ref 0.0–0.7)
Eosinophils Relative: 2 % (ref 0.0–5.0)
HCT: 45.2 % (ref 39.0–52.0)
Hemoglobin: 15.4 g/dL (ref 13.0–17.0)
Lymphocytes Relative: 22.1 % (ref 12.0–46.0)
Lymphs Abs: 1.8 10*3/uL (ref 0.7–4.0)
MCHC: 34.1 g/dL (ref 30.0–36.0)
MCV: 87.7 fl (ref 78.0–100.0)
Monocytes Absolute: 0.9 10*3/uL (ref 0.1–1.0)
Monocytes Relative: 10.6 % (ref 3.0–12.0)
Neutro Abs: 5.3 10*3/uL (ref 1.4–7.7)
Neutrophils Relative %: 64.7 % (ref 43.0–77.0)
Platelets: 281 10*3/uL (ref 150.0–400.0)
RBC: 5.15 Mil/uL (ref 4.22–5.81)
RDW: 13.5 % (ref 11.5–15.5)
WBC: 8.2 10*3/uL (ref 4.0–10.5)

## 2019-03-23 LAB — LIPID PANEL
Cholesterol: 159 mg/dL (ref 0–200)
HDL: 31.6 mg/dL — ABNORMAL LOW (ref >39.00)
LDL Cholesterol: 101 mg/dL — ABNORMAL HIGH (ref 0–99)
NonHDL: 127.65
Total CHOL/HDL Ratio: 5
Triglycerides: 134 mg/dL (ref 0.0–149.0)
VLDL: 26.8 mg/dL (ref 0.0–40.0)

## 2019-03-23 LAB — URIC ACID: Uric Acid, Serum: 7.4 mg/dL (ref 4.0–7.8)

## 2019-03-23 NOTE — Progress Notes (Signed)
Subjective:    Patient ID: Danny [name], male    DOB: 1960/06/27, 58 y.o.   MRN: [id number]  DOS:  03/23/2019 Type of visit - description: CPX In general feeling well. Would like to get the first Shingrix.   Wt Readings from Last 3 Encounters:  03/23/19 295 lb 8 oz (134 kg)  03/11/18 299 lb (135.6 kg)  08/20/17 288 lb 9.6 oz (130.9 kg)     Review of Systems  A 14 point review of systems is negative   Past Medical History:  Diagnosis Date  . Acute tear medial meniscus 2017  . Effusion, left knee 2017  . Epiploic appendagitis 2007  . Gout   . Internal derangement of knee joint, left 2017  . Synovitis of left knee 2017    Past Surgical History:  Procedure Laterality Date  . KNEE ARTHROSCOPY WITH LATERAL MENISECTOMY Left 04/2015    Social History   Socioeconomic History  . Marital status: Married    Spouse name: Not on file  . Number of children: 2  . Years of education: Not on file  . Highest education level: Not on file  Occupational History  . Occupation: Corporate treasurer   Social Needs  . Financial resource strain: Not on file  . Food insecurity    Worry: Not on file    Inability: Not on file  . Transportation needs    Medical: Not on file    Non-medical: Not on file  Tobacco Use  . Smoking status: Never Smoker  . Smokeless tobacco: Never Used  Substance and Sexual Activity  . Alcohol use: Yes    Alcohol/week: 1.0 - 2.0 standard drinks    Types: 1 - 2 Standard drinks or equivalent per week    Comment: socially  . Drug use: No  . Sexual activity: Not on file  Lifestyle  . Physical activity    Days per week: Not on file    Minutes per session: Not on file  . Stress: Not on file  Relationships  . Social Herbalist on phone: Not on file    Gets together: Not on file    Attends religious service: Not on file    Active member of club or organization: Not on file    Attends meetings of clubs or organizations: Not on file    Relationship status: Not on file  . Intimate partner violence    Fear of current or ex partner: Not on file    Emotionally abused: Not on file    Physically abused: Not on file    Forced sexual activity: Not on file  Other Topics Concern  . Not on file  Social History Narrative    Lives w/ wife     Family History  Adopted: Yes  Problem Relation Age of Onset  . Colon cancer Mother 5  . Hypertension Mother   . Coronary artery disease Father 47  . Stroke Father 86  . Prostate cancer Neg Hx      Allergies as of 03/23/2019      Reactions   Penicillins    Other reaction(s): Other (See Comments) CHILDHOOD REACTION      Medication List       Accurate as of March 23, 2019 11:59 PM. If you have any questions, ask your nurse or doctor.        STOP taking these medications   Turmeric 500 MG Tabs Stopped by: Kathlene November, MD  TAKE these medications   allopurinol 300 MG tablet Commonly known as: ZYLOPRIM Take 1 tablet (300 mg total) by mouth daily.   aspirin 81 MG tablet Take 81 mg by mouth daily.   indomethacin 25 MG capsule Commonly known as: INDOCIN Take 1 capsule (25 mg total) by mouth 3 (three) times daily with meals.   vitamin C 1000 MG tablet Take 500 mg by mouth.           Objective:   Physical Exam BP 124/76 (BP Location: Left Arm, Patient Position: Sitting, Cuff Size: Normal)   Pulse 88   Temp (!) 96.4 F (35.8 C) (Temporal)   Resp 16   Ht 6\' 5"  (1.956 m)   Wt 295 lb 8 oz (134 kg)   SpO2 95%   BMI 35.04 kg/m  General: Well developed, NAD, BMI noted Neck: No  thyromegaly  HEENT:  Normocephalic . Face symmetric, atraumatic Lungs:  CTA B Normal respiratory effort, no intercostal retractions, no accessory muscle use. Heart: RRR,  no murmur.  No pretibial edema bilaterally  Abdomen:  Not distended, soft, non-tender. No rebound or rigidity.   Skin: Exposed areas without rash. Not pale. Not jaundice Neurologic:  alert & oriented X3.   Speech normal, gait appropriate for age and unassisted Strength symmetric and appropriate for age.  Psych: Cognition and judgment appear intact.  Cooperative with normal attention span and concentration.  Behavior appropriate. No anxious or depressed appearing.     Assessment     Assessment  Gout  H/o Epiploic appendagitis  OSA per sleep study 10/2016, mild rx cpap or dental devise   PLAN: Here for CPX Gout: No recent episodes, on allopurinol and Indocin as needed, check a uric acid OSA: Feeling great, no symptoms.  Not using any devices or CPAP. Obesity: Benefits of weight loss discussed.  His weight varies over the year. FH CAD: EKG today: NSR.  No symptoms RTC 1 year    This visit occurred during the SARS-CoV-2 public health emergency.  Safety protocols were in place, including screening questions prior to the visit, additional usage of staff PPE, and extensive cleaning of exam room while observing appropriate contact time as indicated for disinfecting solutions.

## 2019-03-23 NOTE — Progress Notes (Signed)
Pre visit review using our clinic review tool, if applicable. No additional management support is needed unless otherwise documented below in the visit note. 

## 2019-03-23 NOTE — Patient Instructions (Addendum)
GO TO THE LAB : Get the blood work     GO TO THE FRONT DESK Schedule your Shingrix  # 2 in 2 to 3 months  Schedule your next appointment   for physical exam in 1 year

## 2019-03-24 NOTE — Assessment & Plan Note (Signed)
Here for CPX Gout: No recent episodes, on allopurinol and Indocin as needed, check a uric acid OSA: Feeling great, no symptoms.  Not using any devices or CPAP. Obesity: Benefits of weight loss discussed.  His weight varies over the year. FH CAD: EKG today: NSR.  No symptoms RTC 1 year

## 2019-03-24 NOTE — Assessment & Plan Note (Signed)
-  Td 2012  - Zostavax 2014 -  Shingrix #1 today -Had a flu shot - FH Colon cancer, pt reports a Cscope w/ Dr Cher Nakai 2007  and 07-2014, next per GI --Prostate ca screening : DRE-PSA wnl 2019 - (+) FH CAD: Asx, plan is to control his CV RF  - Labs: CMP, FLP, CBC, TSH, PSA, uric acid -Diet and exercise discussed

## 2019-04-19 ENCOUNTER — Telehealth: Payer: Self-pay

## 2019-04-19 NOTE — Telephone Encounter (Signed)
Physical form received, completed and faxed to [facility] at [phone]. Form sent for scanning.

## 2019-05-25 ENCOUNTER — Other Ambulatory Visit: Payer: Self-pay

## 2019-05-25 ENCOUNTER — Ambulatory Visit (INDEPENDENT_AMBULATORY_CARE_PROVIDER_SITE_OTHER): Payer: Managed Care, Other (non HMO) | Admitting: *Deleted

## 2019-05-25 DIAGNOSIS — Z23 Encounter for immunization: Secondary | ICD-10-CM

## 2019-05-25 NOTE — Progress Notes (Signed)
Patient here for 2nd shingles vaccine.   Vaccine given in left deltoid and patient tolerated well. 

## 2019-10-13 DIAGNOSIS — Z800 Family history of malignant neoplasm of digestive organs: Secondary | ICD-10-CM | POA: Insufficient documentation

## 2019-10-14 ENCOUNTER — Encounter: Payer: Self-pay | Admitting: Internal Medicine

## 2019-10-14 LAB — HM COLONOSCOPY

## 2020-03-28 ENCOUNTER — Ambulatory Visit (INDEPENDENT_AMBULATORY_CARE_PROVIDER_SITE_OTHER): Payer: Managed Care, Other (non HMO) | Admitting: Internal Medicine

## 2020-03-28 ENCOUNTER — Encounter: Payer: Self-pay | Admitting: Internal Medicine

## 2020-03-28 ENCOUNTER — Other Ambulatory Visit: Payer: Self-pay

## 2020-03-28 VITALS — BP 141/86 | HR 94 | Temp 98.9°F | Ht 77.0 in | Wt 308.0 lb

## 2020-03-28 DIAGNOSIS — Z0000 Encounter for general adult medical examination without abnormal findings: Secondary | ICD-10-CM | POA: Diagnosis not present

## 2020-03-28 DIAGNOSIS — E669 Obesity, unspecified: Secondary | ICD-10-CM | POA: Diagnosis not present

## 2020-03-28 DIAGNOSIS — M109 Gout, unspecified: Secondary | ICD-10-CM

## 2020-03-28 DIAGNOSIS — Z125 Encounter for screening for malignant neoplasm of prostate: Secondary | ICD-10-CM | POA: Diagnosis not present

## 2020-03-28 LAB — CBC WITH DIFFERENTIAL/PLATELET
Basophils Absolute: 0.1 10*3/uL (ref 0.0–0.1)
Basophils Relative: 0.7 % (ref 0.0–3.0)
Eosinophils Absolute: 0.1 10*3/uL (ref 0.0–0.7)
Eosinophils Relative: 1.8 % (ref 0.0–5.0)
HCT: 46 % (ref 39.0–52.0)
Hemoglobin: 15.8 g/dL (ref 13.0–17.0)
Lymphocytes Relative: 22.7 % (ref 12.0–46.0)
Lymphs Abs: 1.9 10*3/uL (ref 0.7–4.0)
MCHC: 34.4 g/dL (ref 30.0–36.0)
MCV: 85.8 fl (ref 78.0–100.0)
Monocytes Absolute: 0.8 10*3/uL (ref 0.1–1.0)
Monocytes Relative: 9.6 % (ref 3.0–12.0)
Neutro Abs: 5.4 10*3/uL (ref 1.4–7.7)
Neutrophils Relative %: 65.2 % (ref 43.0–77.0)
Platelets: 265 10*3/uL (ref 150.0–400.0)
RBC: 5.37 Mil/uL (ref 4.22–5.81)
RDW: 13.9 % (ref 11.5–15.5)
WBC: 8.3 10*3/uL (ref 4.0–10.5)

## 2020-03-28 LAB — LIPID PANEL
Cholesterol: 147 mg/dL (ref 0–200)
HDL: 36 mg/dL — ABNORMAL LOW (ref >39.00)
LDL Cholesterol: 88 mg/dL (ref 0–99)
NonHDL: 110.97
Total CHOL/HDL Ratio: 4
Triglycerides: 113 mg/dL (ref 0.0–149.0)
VLDL: 22.6 mg/dL (ref 0.0–40.0)

## 2020-03-28 LAB — PSA: PSA: 0.84 ng/mL (ref 0.10–4.00)

## 2020-03-28 LAB — COMPREHENSIVE METABOLIC PANEL
ALT: 29 U/L (ref 0–53)
AST: 21 U/L (ref 0–37)
Albumin: 4.2 g/dL (ref 3.5–5.2)
Alkaline Phosphatase: 64 U/L (ref 39–117)
BUN: 12 mg/dL (ref 6–23)
CO2: 31 mEq/L (ref 19–32)
Calcium: 9 mg/dL (ref 8.4–10.5)
Chloride: 101 mEq/L (ref 96–112)
Creatinine, Ser: 0.83 mg/dL (ref 0.40–1.50)
GFR: 97.16 mL/min (ref >60.00)
Glucose, Bld: 103 mg/dL — ABNORMAL HIGH (ref 70–99)
Potassium: 3.7 mEq/L (ref 3.5–5.1)
Sodium: 139 mEq/L (ref 135–145)
Total Bilirubin: 0.8 mg/dL (ref 0.2–1.2)
Total Protein: 7 g/dL (ref 6.0–8.3)

## 2020-03-28 LAB — URIC ACID: Uric Acid, Serum: 7.7 mg/dL (ref 4.0–7.8)

## 2020-03-28 LAB — TSH: TSH: 1.16 u[IU]/mL (ref 0.35–4.50)

## 2020-03-28 LAB — HEMOGLOBIN A1C: Hgb A1c MFr Bld: 6.4 % (ref 4.6–6.5)

## 2020-03-28 MED ORDER — SILDENAFIL CITRATE 20 MG PO TABS: 60.0000 mg | ORAL_TABLET | Freq: Every evening | ORAL | 3 refills | Status: DC | PRN

## 2020-03-28 NOTE — Progress Notes (Signed)
Subjective:    Patient ID: Danny [name], male    DOB: 12-17-1960, 59 y.o.   MRN: [id number]  DOS:  03/28/2020 Type of visit - description: CPX  In general feels well but did report a couple problems  Has noted poor erections lately, difficulting penetration, request treatment. Denies any LUTS. Has knee pain and some bowing, plans to see Ortho. Has chronic cough, for years, no GERD symptoms, PND?   Wt Readings from Last 3 Encounters:  03/28/20 (!) 308 lb (139.7 kg)  03/23/19 295 lb 8 oz (134 kg)  03/11/18 299 lb (135.6 kg)   BP Readings from Last 3 Encounters:  03/28/20 (!) 141/86  03/23/19 124/76  03/11/18 128/80     Review of Systems  Other than above, a 14 point review of systems is negative   A 14 point review of systems is negative    Past Medical History:  Diagnosis Date   Acute tear medial meniscus 2017   Effusion, left knee 2017   Epiploic appendagitis 2007   Gout    Internal derangement of knee joint, left 2017   Synovitis of left knee 2017    Past Surgical History:  Procedure Laterality Date   KNEE ARTHROSCOPY WITH LATERAL MENISECTOMY Left 04/2015    Allergies as of 03/28/2020      Reactions   Penicillins    Other reaction(s): Other (See Comments) CHILDHOOD REACTION      Medication List       Accurate as of March 28, 2020 11:59 PM. If you have any questions, ask your nurse or doctor.        allopurinol 300 MG tablet Commonly known as: ZYLOPRIM Take 1 tablet (300 mg total) by mouth daily.   aspirin 81 MG tablet Take 81 mg by mouth daily.   indomethacin 25 MG capsule Commonly known as: INDOCIN Take 1 capsule (25 mg total) by mouth 3 (three) times daily with meals.   sildenafil 20 MG tablet Commonly known as: REVATIO Take 3-4 tablets (60-80 mg total) by mouth at bedtime as needed. Started by: Willow Ora, MD   vitamin C 1000 MG tablet Take 500 mg by mouth.          Objective:   Physical Exam BP (!) 141/86 (BP  Location: Right Arm, Patient Position: Sitting, Cuff Size: Large)    Pulse 94    Temp 98.9 F (37.2 C) (Oral)    Ht 6\' 5"  (1.956 m)    Wt (!) 308 lb (139.7 kg)    SpO2 90%    BMI 36.52 kg/m  General: Well developed, NAD, BMI noted Neck: No  thyromegaly  HEENT:  Normocephalic . Face symmetric, atraumatic Lungs:  CTA B Normal respiratory effort, no intercostal retractions, no accessory muscle use. Heart: RRR,  no murmur.  Abdomen:  Not distended, soft, non-tender. No rebound or rigidity.   Lower extremities: Bowing  noted DRE: Normal sphincter tone, no stools, prostate normal Skin: Exposed areas without rash. Not pale. Not jaundice Neurologic:  alert & oriented X3.  Speech normal, gait somewhat limited by bowing of the legs and BMI Strength symmetric and appropriate for age.  Psych: Cognition and judgment appear intact.  Cooperative with normal attention span and concentration.  Behavior appropriate. No anxious or depressed appearing.     Assessment     Assessment  Gout  H/o Epiploic appendagitis  OSA per sleep study 10/2016, mild rx cpap or dental devise   PLAN: Here for CPX Slightly elevated  BP today, recommend ambulatory checking. Gout: On allopurinol, hardly ever uses Indocin.  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 Chronic cough (for years): No GERD, postnasal dripping?  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  Important part of the treatment is weight loss. Obesity: Recommend a visit to the wellness clinic RTC 1 year, sooner if BP elevated     This visit occurred during the SARS-CoV-2 public health emergency.  Safety protocols were in place, including screening questions prior to the visit, additional usage of staff PPE, and extensive cleaning of exam room while observing appropriate contact time as indicated for disinfecting  solutions.

## 2020-03-28 NOTE — Patient Instructions (Signed)
Check the  blood pressure twice a month. BP GOAL is between 110/65 and  135/85. If it is consistently higher or lower, let me know  Consider a visit to the wellness clinic  Start using Flonase 2 sprays on each side of the nose daily  GO TO THE LAB : Get the blood work     GO TO THE FRONT DESK, PLEASE SCHEDULE YOUR APPOINTMENTS Come back for a checkup in 1 year.  Sooner if your blood pressure is slightly elevated

## 2020-03-29 ENCOUNTER — Encounter: Payer: Self-pay | Admitting: Internal Medicine

## 2020-03-29 NOTE — Assessment & Plan Note (Signed)
-  [id number]  - Zostavax 2014 -Shingrix x 2 - s/p 2 covid vax, plans to get a booster  -Had a flu shot - FH Colon cancer, pt reports a Cscope w/ Dr Candelaria Stagers 2007,also c-scopes on  07-2014 and 10/14/2019 (3 years per pt) --Prostate cascreening : DRE wnl, check a psa - (+) FH CAD: on asa , asx,plan is tocontrol his CV RF  - Labs: CMP, FLP, CBC, A1c, TSH, PSA, uric acid -Diet and exercise discussed

## 2020-03-29 NOTE — Assessment & Plan Note (Signed)
Here for CPX Slightly elevated BP today, recommend ambulatory checking. Gout: On allopurinol, hardly ever uses Indocin.  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 Chronic cough (for years): No GERD, postnasal dripping?  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  Important part of the treatment is weight loss. Obesity: Recommend a visit to the wellness clinic RTC 1 year, sooner if BP elevated

## 2020-05-11 ENCOUNTER — Ambulatory Visit (INDEPENDENT_AMBULATORY_CARE_PROVIDER_SITE_OTHER): Payer: Managed Care, Other (non HMO) | Admitting: Family Medicine

## 2020-05-24 ENCOUNTER — Other Ambulatory Visit: Payer: Self-pay

## 2020-05-24 ENCOUNTER — Ambulatory Visit (INDEPENDENT_AMBULATORY_CARE_PROVIDER_SITE_OTHER): Payer: Managed Care, Other (non HMO) | Admitting: Bariatrics

## 2020-05-24 ENCOUNTER — Encounter (INDEPENDENT_AMBULATORY_CARE_PROVIDER_SITE_OTHER): Payer: Self-pay | Admitting: Bariatrics

## 2020-05-24 VITALS — BP 126/82 | HR 82 | Temp 97.7°F | Ht 77.0 in | Wt 292.0 lb

## 2020-05-24 DIAGNOSIS — Z0289 Encounter for other administrative examinations: Secondary | ICD-10-CM

## 2020-05-24 DIAGNOSIS — R7303 Prediabetes: Secondary | ICD-10-CM

## 2020-05-24 DIAGNOSIS — R5383 Other fatigue: Secondary | ICD-10-CM | POA: Diagnosis not present

## 2020-05-24 DIAGNOSIS — Z9189 Other specified personal risk factors, not elsewhere classified: Secondary | ICD-10-CM | POA: Diagnosis not present

## 2020-05-24 DIAGNOSIS — M109 Gout, unspecified: Secondary | ICD-10-CM

## 2020-05-24 DIAGNOSIS — Z1331 Encounter for screening for depression: Secondary | ICD-10-CM

## 2020-05-24 DIAGNOSIS — E559 Vitamin D deficiency, unspecified: Secondary | ICD-10-CM

## 2020-05-24 DIAGNOSIS — R0602 Shortness of breath: Secondary | ICD-10-CM

## 2020-05-24 DIAGNOSIS — E669 Obesity, unspecified: Secondary | ICD-10-CM

## 2020-05-24 DIAGNOSIS — Z6834 Body mass index (BMI) 34.0-34.9, adult: Secondary | ICD-10-CM

## 2020-05-25 ENCOUNTER — Encounter (INDEPENDENT_AMBULATORY_CARE_PROVIDER_SITE_OTHER): Payer: Self-pay | Admitting: Bariatrics

## 2020-05-25 ENCOUNTER — Ambulatory Visit (INDEPENDENT_AMBULATORY_CARE_PROVIDER_SITE_OTHER): Payer: Managed Care, Other (non HMO) | Admitting: Family Medicine

## 2020-05-25 DIAGNOSIS — R739 Hyperglycemia, unspecified: Secondary | ICD-10-CM | POA: Insufficient documentation

## 2020-05-25 DIAGNOSIS — R7303 Prediabetes: Secondary | ICD-10-CM | POA: Insufficient documentation

## 2020-05-25 DIAGNOSIS — E559 Vitamin D deficiency, unspecified: Secondary | ICD-10-CM | POA: Insufficient documentation

## 2020-05-25 DIAGNOSIS — E119 Type 2 diabetes mellitus without complications: Secondary | ICD-10-CM | POA: Insufficient documentation

## 2020-05-25 LAB — COMPREHENSIVE METABOLIC PANEL
ALT: 30 IU/L (ref 0–44)
AST: 21 IU/L (ref 0–40)
Albumin/Globulin Ratio: 1.8 (ref 1.2–2.2)
Albumin: 4.7 g/dL (ref 3.8–4.9)
Alkaline Phosphatase: 82 IU/L (ref 44–121)
BUN/Creatinine Ratio: 14 (ref 9–20)
BUN: 11 mg/dL (ref 6–24)
Bilirubin Total: 0.5 mg/dL (ref 0.0–1.2)
CO2: 27 mmol/L (ref 20–29)
Calcium: 9.5 mg/dL (ref 8.7–10.2)
Chloride: 102 mmol/L (ref 96–106)
Creatinine, Ser: 0.77 mg/dL (ref 0.76–1.27)
GFR calc Af Amer: 116 mL/min/{1.73_m2} (ref >59)
GFR calc non Af Amer: 101 mL/min/{1.73_m2} (ref >59)
Globulin, Total: 2.6 g/dL (ref 1.5–4.5)
Glucose: 105 mg/dL — ABNORMAL HIGH (ref 65–99)
Potassium: 4.1 mmol/L (ref 3.5–5.2)
Sodium: 142 mmol/L (ref 134–144)
Total Protein: 7.3 g/dL (ref 6.0–8.5)

## 2020-05-25 LAB — CBC WITH DIFFERENTIAL/PLATELET
Basophils Absolute: 0 10*3/uL (ref 0.0–0.2)
Basos: 1 %
EOS (ABSOLUTE): 0.1 10*3/uL (ref 0.0–0.4)
Eos: 1 %
Hematocrit: 48.8 % (ref 37.5–51.0)
Hemoglobin: 16.4 g/dL (ref 13.0–17.7)
Immature Grans (Abs): 0 10*3/uL (ref 0.0–0.1)
Immature Granulocytes: 0 %
Lymphocytes Absolute: 1.7 10*3/uL (ref 0.7–3.1)
Lymphs: 22 %
MCH: 29.2 pg (ref 26.6–33.0)
MCHC: 33.6 g/dL (ref 31.5–35.7)
MCV: 87 fL (ref 79–97)
Monocytes Absolute: 0.8 10*3/uL (ref 0.1–0.9)
Monocytes: 11 %
Neutrophils Absolute: 5 10*3/uL (ref 1.4–7.0)
Neutrophils: 65 %
Platelets: 293 10*3/uL (ref 150–450)
RBC: 5.61 x10E6/uL (ref 4.14–5.80)
RDW: 13.3 % (ref 11.6–15.4)
WBC: 7.7 10*3/uL (ref 3.4–10.8)

## 2020-05-25 LAB — T3: T3, Total: 140 ng/dL (ref 71–180)

## 2020-05-25 LAB — LIPID PANEL WITH LDL/HDL RATIO
Cholesterol, Total: 159 mg/dL (ref 100–199)
HDL: 36 mg/dL — ABNORMAL LOW (ref >39)
LDL Chol Calc (NIH): 105 mg/dL — ABNORMAL HIGH (ref 0–99)
LDL/HDL Ratio: 2.9 ratio (ref 0.0–3.6)
Triglycerides: 94 mg/dL (ref 0–149)
VLDL Cholesterol Cal: 18 mg/dL (ref 5–40)

## 2020-05-25 LAB — VITAMIN B12: Vitamin B-12: 296 pg/mL (ref 232–1245)

## 2020-05-25 LAB — HEMOGLOBIN A1C
Est. average glucose Bld gHb Est-mCnc: 131 mg/dL
Hgb A1c MFr Bld: 6.2 % — ABNORMAL HIGH (ref 4.8–5.6)

## 2020-05-25 LAB — VITAMIN D 25 HYDROXY (VIT D DEFICIENCY, FRACTURES): Vit D, 25-Hydroxy: 14.1 ng/mL — ABNORMAL LOW (ref 30.0–100.0)

## 2020-05-25 LAB — TSH: TSH: 0.867 u[IU]/mL (ref 0.450–4.500)

## 2020-05-25 LAB — INSULIN, RANDOM: INSULIN: 23.1 u[IU]/mL (ref 2.6–24.9)

## 2020-05-25 LAB — T4, FREE: Free T4: 1.34 ng/dL (ref 0.82–1.77)

## 2020-05-25 NOTE — Progress Notes (Signed)
Chief Complaint:   OBESITY Danny [name] (MR# [id number]) is a 60 y.o. male who presents for evaluation and treatment of obesity and related comorbidities. Current BMI is Body mass index is 34.63 kg/m. Danny [name] has been struggling with his weight for many years and has been unsuccessful in either losing weight, maintaining weight loss, or reaching his healthy weight goal.  Danny [name] is currently in the action stage of change and ready to dedicate time achieving and maintaining a healthier weight. Danny [name] is interested in becoming our patient and working on intensive lifestyle modifications including (but not limited to) diet and exercise for weight loss.  Danny [name] craves sweet items and tends to snack in the afternoons and at night.  Danny [name]'s habits were reviewed today and are as follows: His family eats meals together, he thinks his family will eat healthier with him, his desired weight loss is 57 pounds, he started gaining weight 15 years ago, his heaviest weight ever was 315 pounds, he craves sweet items, he snacks frequently in the evenings, he is frequently drinking liquids with calories, he frequently makes poor food choices, he frequently eats larger portions than normal and he struggles with emotional eating.  Depression Screen Danny [name]'s Food and Mood (modified PHQ-9) score was 5.  Depression screen Danny [name] 2/9 05/24/2020  Decreased Interest 1  Down, Depressed, Hopeless 0  PHQ - 2 Score 1  Altered sleeping 2  Tired, decreased energy 1  Change in appetite 1  Feeling bad or failure about yourself  0  Trouble concentrating 0  Moving slowly or fidgety/restless 0  Suicidal thoughts 0  PHQ-9 Score 5  Difficult doing work/chores Not difficult at all   Subjective:   1. Other fatigue Danny [name] admits to daytime somnolence and reports waking up still tired. Patent has a history of symptoms of daytime fatigue, morning fatigue and snoring. Danny [name] generally gets 6 or 7 hours of sleep per  night, and states that he has generally restful sleep. Snoring is present. Apneic episodes are not present. Epworth Sleepiness Score is 11.  2. SOB (shortness of breath) on exertion Danny [name] notes increasing shortness of breath with exercising and seems to be worsening over time with weight gain. He notes getting out of breath sooner with activity than he used to. This has gotten worse recently. Danny [name] denies shortness of breath at rest or orthopnea.  3. Prediabetes Danny [name] has a diagnosis of prediabetes based on his elevated HgA1c and was informed this puts him at greater risk of developing diabetes. He continues to work on diet and exercise to decrease his risk of diabetes. He denies nausea or hypoglycemia.  Lab Results  Component Value Date   HGBA1C 6.4 03/28/2020   4. Vitamin D deficiency He is currently taking no vitamin D supplement.   5. Gout, unspecified cause, unspecified chronicity, unspecified site Danny [name] is taking allopurinol.  6. Depression screening Danny [name] was screened for depression as part of his new patient workup today.  PHQ-9 is 5.  7. At risk for diabetes mellitus Danny [name] is at higher than average risk for developing diabetes due to obesity and prediabetes.   Assessment/Plan:   1. Other fatigue Danny [name] does feel that his weight is causing his energy to be lower than it should be. Fatigue may be related to obesity, depression or many other causes. Labs will be ordered, and in the meanwhile, Danny [name] will focus on self care including making healthy food choices, increasing physical activity and focusing on stress reduction.  - EKG  12-Lead - Vitamin B12 - CBC with Differential/Platelet - Comprehensive metabolic panel - Insulin, random - Hemoglobin A1c - Lipid Panel With LDL/HDL Ratio - T3 - T4, free - TSH - VITAMIN D 25 Hydroxy (Vit-D Deficiency, Fractures)  2. SOB (shortness of breath) on exertion Danny [name] does feel that he gets out of breath more easily  that he used to when he exercises. Danny [name]'s shortness of breath appears to be obesity related and exercise induced. He has agreed to work on weight loss and gradually increase exercise to treat his exercise induced shortness of breath. Will continue to monitor closely.  - Lipid Panel With LDL/HDL Ratio  3. Prediabetes Danny [name] will continue to work on weight loss, exercise, and decreasing simple carbohydrates to help decrease the risk of diabetes.  Decrease carbohydrates, increase healthy fats and proteins.  - Insulin, random  4. Vitamin D deficiency Low Vitamin D level contributes to fatigue and are associated with obesity, breast, and colon cancer. He agrees to continue to take prescription Vitamin D @50 ,000 IU every week and will check vitamin D level today.  - VITAMIN D 25 Hydroxy (Vit-D Deficiency, Fractures)  5. Gout, unspecified cause, unspecified chronicity, unspecified site Discussed with him that gout may impede weight loss.  Continue medications.  6. Depression screening Danny [name]'s depression screening is negative.  7. At risk for diabetes mellitus Danny [name] was given approximately 15 minutes of diabetes education and counseling today. We discussed intensive lifestyle modifications today with an emphasis on weight loss as well as increasing exercise and decreasing simple carbohydrates in his diet. We also reviewed medication options with an emphasis on risk versus benefit of those discussed.   Repetitive spaced learning was employed today to elicit superior memory formation and behavioral change.  8. Class 1 obesity with serious comorbidity and body mass index (BMI) of 34.0 to 34.9 in adult, unspecified obesity type  Danny [name] is currently in the action stage of change and his goal is to continue with weight loss efforts. I recommend Danny [name] begin the structured treatment plan as follows:  He has agreed to the Category 4 Plan.  He will work on meal planning, eliminating all  sugary drinks from his diet, and decreasing his portions sizes.  Labs from 05/29/2019, including CMP, lipid panel, CBC, A1c, and TSH were reviewed with the patient today.  Exercise goals: No exercise has been prescribed at this time.   Behavioral modification strategies: increasing lean protein intake, decreasing simple carbohydrates, increasing vegetables, increasing water intake, decreasing eating out, no skipping meals, meal planning and cooking strategies, keeping healthy foods in the home and planning for success.  He was informed of the importance of frequent follow-up visits to maximize his success with intensive lifestyle modifications for his multiple health conditions. He was informed we would discuss his lab results at his next visit unless there is a critical issue that needs to be addressed sooner. Danny [name] agreed to keep his next visit at the agreed upon time to discuss these results.  Objective:   Blood pressure 126/82, pulse 82, temperature 97.7 F (36.5 C), height 6\' 5"  (1.956 m), weight 292 lb (132.5 kg), SpO2 94 %. Body mass index is 34.63 kg/m.  EKG: Normal sinus rhythm, rate 85 bpm.  Indirect Calorimeter completed today shows a VO2 of 465 and a REE of 3240.  His calculated basal metabolic rate is Danny [name] thus his basal metabolic rate is better than expected.  General: Cooperative, alert, well developed, in no acute distress. HEENT: Conjunctivae and lids unremarkable.  Cardiovascular: Regular rhythm.  Lungs: Normal work of breathing. Neurologic: No focal deficits.   Lab Results  Component Value Date   CREATININE 0.83 03/28/2020   BUN 12 03/28/2020   NA 139 03/28/2020   K 3.7 03/28/2020   CL 101 03/28/2020   CO2 31 03/28/2020   Lab Results  Component Value Date   ALT 29 03/28/2020   AST 21 03/28/2020   ALKPHOS 64 03/28/2020   BILITOT 0.8 03/28/2020   Lab Results  Component Value Date   HGBA1C 6.4 03/28/2020   Lab Results  Component Value Date   TSH 1.16  03/28/2020   Lab Results  Component Value Date   CHOL 147 03/28/2020   HDL 36.00 (L) 03/28/2020   LDLCALC 88 03/28/2020   TRIG 113.0 03/28/2020   CHOLHDL 4 03/28/2020   Lab Results  Component Value Date   WBC 8.3 03/28/2020   HGB 15.8 03/28/2020   HCT 46.0 03/28/2020   MCV 85.8 03/28/2020   PLT 265.0 03/28/2020   Attestation Statements:   Reviewed by clinician on day of visit: allergies, medications, problem list, medical history, surgical history, family history, social history, and previous encounter notes.  I, Insurance claims handler, CMA, am acting as Energy manager for [employer], DO  I have reviewed the above documentation for accuracy and completeness, and I agree with the above. Corinna Capra, DO

## 2020-06-07 ENCOUNTER — Encounter (INDEPENDENT_AMBULATORY_CARE_PROVIDER_SITE_OTHER): Payer: Self-pay | Admitting: Bariatrics

## 2020-06-07 ENCOUNTER — Ambulatory Visit (INDEPENDENT_AMBULATORY_CARE_PROVIDER_SITE_OTHER): Payer: Managed Care, Other (non HMO) | Admitting: Bariatrics

## 2020-06-07 ENCOUNTER — Other Ambulatory Visit: Payer: Self-pay

## 2020-06-07 VITALS — BP 124/81 | HR 73 | Temp 98.2°F | Ht 77.0 in | Wt 284.0 lb

## 2020-06-07 DIAGNOSIS — E538 Deficiency of other specified B group vitamins: Secondary | ICD-10-CM | POA: Diagnosis not present

## 2020-06-07 DIAGNOSIS — E786 Lipoprotein deficiency: Secondary | ICD-10-CM

## 2020-06-07 DIAGNOSIS — E559 Vitamin D deficiency, unspecified: Secondary | ICD-10-CM | POA: Diagnosis not present

## 2020-06-07 DIAGNOSIS — Z9189 Other specified personal risk factors, not elsewhere classified: Secondary | ICD-10-CM | POA: Diagnosis not present

## 2020-06-07 DIAGNOSIS — Z6833 Body mass index (BMI) 33.0-33.9, adult: Secondary | ICD-10-CM

## 2020-06-07 DIAGNOSIS — K5909 Other constipation: Secondary | ICD-10-CM

## 2020-06-07 DIAGNOSIS — E669 Obesity, unspecified: Secondary | ICD-10-CM

## 2020-06-07 MED ORDER — VITAMIN D (ERGOCALCIFEROL) 1.25 MG (50000 UNIT) PO CAPS: 50000.0000 [IU] | ORAL_CAPSULE | ORAL | 0 refills | Status: DC

## 2020-06-13 NOTE — Progress Notes (Signed)
Chief Complaint:   OBESITY Danny [name] is here to discuss his progress with his obesity treatment plan along with follow-up of his obesity related diagnoses. Danny [name] is on the Category 4 Plan and states he is following his eating plan approximately 25% of the time. [name] states he is not exercising regularly at this time.  Today's visit was #: 2 Starting weight: 292 lbs Starting date: 05/24/2020 Today's weight: 284 lbs Today's date: 06/07/2020 Total lbs lost to date: 8 lbs Total lbs lost since last in-office visit: 8 lbs  Interim History: Danny [name] is down 8 pounds from his first visit.  He did not have all the food at first.  Subjective:   1. Vitamin D deficiency Brach's Vitamin D level was 14.1 on 05/24/2020. He is currently taking no vitamin D supplement. He denies nausea, vomiting or muscle weakness.  2. Low vitamin B12 level Danny [name] is not taking a vitamin B12 supplement.  B12 is low normal at 296.  Lab Results  Component Value Date   VITAMINB12 296 05/24/2020   3. Low HDL (under 40) Danny [name] has a low HDL of 30.  Lab Results  Component Value Date   ALT 30 05/24/2020   AST 21 05/24/2020   ALKPHOS 82 05/24/2020   BILITOT 0.5 05/24/2020   Lab Results  Component Value Date   CHOL 159 05/24/2020   HDL 36 (L) 05/24/2020   LDLCALC 105 (H) 05/24/2020   TRIG 94 05/24/2020   CHOLHDL 4 03/28/2020   4. Other constipation [name] notes constipation with increasing his protein intake.  5. At risk for osteoporosis Danny [name] is at higher risk of osteopenia and osteoporosis due to Vitamin D deficiency.   Assessment/Plan:   1. Vitamin D deficiency Low Vitamin D level contributes to fatigue and are associated with obesity, breast, and colon cancer. He agrees to start to take prescription Vitamin D @50 ,000 IU every week and will follow-up for routine testing of Vitamin D, at least 2-3 times per year to avoid over-replacement.  - Refill Vitamin D, Ergocalciferol, (DRISDOL)  1.25 MG (50000 UNIT) CAPS capsule; Take 1 capsule (50,000 Units total) by mouth every 7 (seven) days.  Dispense: 5 capsule; Refill: 0  2. Low vitamin B12 level The diagnosis was reviewed with the patient. Counseling provided today, see below. We will continue to monitor. Recommend starting OTC vitamin B12 1,000 mcg daily.  Counseling . The body needs vitamin B12: to make red blood cells; to make DNA; and to help the nerves work properly so they can carry messages from the brain to the body.  . The main causes of vitamin B12 deficiency include dietary deficiency, digestive diseases, pernicious anemia, and having a surgery in which part of the stomach or small intestine is removed.  . Certain medicines can make it harder for the body to absorb vitamin B12. These medicines include: heartburn medications; some antibiotics; some medications used to treat diabetes, gout, and high cholesterol.  . In some cases, there are no symptoms of this condition. If the condition leads to anemia or nerve damage, various symptoms can occur, such as weakness or fatigue, shortness of breath, and numbness or tingling in your hands and feet.   . Treatment:  o May include taking vitamin B12 supplements.  o Avoid alcohol.  o Eat lots of healthy foods that contain vitamin B12: - Beef, pork, chicken, , and organ meats, such as liver.  - Seafood: This includes clams, rainbow trout, salmon, tuna, and haddock. Eggs.  -  Cereal and dairy products that are fortified: This means that vitamin B12 has been added to the food.   - Start vitamin B-12 (CYANOCOBALAMIN) 250 MCG tablet; Take 250 mcg by mouth daily.  3. Low HDL (under 40) Decrease carbohydrates, eliminate saturated fats, increase protein.  4. Other constipation Danny [name] was informed that a decrease in bowel movement frequency is normal while losing weight, but stools should not be hard or painful. Increase water intake.  Use MiraLAX and Citrucel as  needed.  Counseling Getting to Good Bowel Health: Your goal is to have one soft bowel movement each day. Drink at least 8 glasses of water each day. Eat plenty of fiber (goal is over 25 grams each day). It is best to get most of your fiber from dietary sources which includes leafy green vegetables, fresh fruit, and whole grains. You may need to add fiber with the help of OTC fiber supplements. These include Metamucil, Citrucel, and Flaxseed. If you are still having trouble, try adding Miralax or Magnesium Citrate. If all of these changes do not work, Dietitian.  5. At risk for osteoporosis Danny [name] was given approximately 15 minutes of counseling today regarding prevention of constipation. He was encouraged to increase water and fiber intake.   6. Class 1 obesity with serious comorbidity and body mass index (BMI) of 33.0 to 33.9 in adult, unspecified obesity type  Danny [name] is currently in the action stage of change. As such, his goal is to continue with weight loss efforts. He has agreed to the Category 4 Plan.   He will work on meal planning.  Labs from 05/24/2020 (CMP, lipid panel, vitamin D, B12, CBC, A1c, insulin level, and thyroid panel) were reviewed today.  Protein Equivalent sheet provided today.  Exercise goals: Had cortisone injection and will be more active.  Behavioral modification strategies: increasing lean protein intake, decreasing simple carbohydrates, increasing vegetables, increasing water intake, decreasing eating out, no skipping meals, meal planning and cooking strategies, keeping healthy foods in the home and planning for success.  Danny [name] has agreed to follow-up with our clinic in 2 weeks. He was informed of the importance of frequent follow-up visits to maximize his success with intensive lifestyle modifications for his multiple health conditions.   Objective:   Blood pressure 124/81, pulse 73, temperature 98.2 F (36.8 C), height 6\' 5"  (1.956 m), weight 284  lb (128.8 kg), SpO2 96 %. Body mass index is 33.68 kg/m.  General: Cooperative, alert, well developed, in no acute distress. HEENT: Conjunctivae and lids unremarkable. Cardiovascular: Regular rhythm.  Lungs: Normal work of breathing. Neurologic: No focal deficits.   Lab Results  Component Value Date   CREATININE 0.77 05/24/2020   BUN 11 05/24/2020   NA 142 05/24/2020   K 4.1 05/24/2020   CL 102 05/24/2020   CO2 27 05/24/2020   Lab Results  Component Value Date   ALT 30 05/24/2020   AST 21 05/24/2020   ALKPHOS 82 05/24/2020   BILITOT 0.5 05/24/2020   Lab Results  Component Value Date   HGBA1C 6.2 (H) 05/24/2020   HGBA1C 6.4 03/28/2020   Lab Results  Component Value Date   INSULIN 23.1 05/24/2020   Lab Results  Component Value Date   TSH 0.867 05/24/2020   Lab Results  Component Value Date   CHOL 159 05/24/2020   HDL 36 (L) 05/24/2020   LDLCALC 105 (H) 05/24/2020   TRIG 94 05/24/2020   CHOLHDL 4 03/28/2020   Lab Results  Component Value  Date   WBC 7.7 05/24/2020   HGB 16.4 05/24/2020   HCT 48.8 05/24/2020   MCV 87 05/24/2020   PLT 293 05/24/2020   Attestation Statements:   Reviewed by clinician on day of visit: allergies, medications, problem list, medical history, surgical history, family history, social history, and previous encounter notes.  I, Insurance claims handler, CMA, am acting as Energy manager for [employer], DO  I have reviewed the above documentation for accuracy and completeness, and I agree with the above. Corinna Capra, DO

## 2020-06-15 ENCOUNTER — Encounter (INDEPENDENT_AMBULATORY_CARE_PROVIDER_SITE_OTHER): Payer: Self-pay | Admitting: Bariatrics

## 2020-06-26 ENCOUNTER — Ambulatory Visit (INDEPENDENT_AMBULATORY_CARE_PROVIDER_SITE_OTHER): Payer: Managed Care, Other (non HMO) | Admitting: Bariatrics

## 2020-06-26 ENCOUNTER — Encounter (INDEPENDENT_AMBULATORY_CARE_PROVIDER_SITE_OTHER): Payer: Self-pay | Admitting: Bariatrics

## 2020-06-26 ENCOUNTER — Other Ambulatory Visit: Payer: Self-pay

## 2020-06-26 VITALS — BP 125/80 | HR 75 | Temp 97.5°F | Ht 77.0 in | Wt 276.0 lb

## 2020-06-26 DIAGNOSIS — E6609 Other obesity due to excess calories: Secondary | ICD-10-CM

## 2020-06-26 DIAGNOSIS — E669 Obesity, unspecified: Secondary | ICD-10-CM

## 2020-06-26 DIAGNOSIS — Z6832 Body mass index (BMI) 32.0-32.9, adult: Secondary | ICD-10-CM | POA: Diagnosis not present

## 2020-06-26 DIAGNOSIS — R7303 Prediabetes: Secondary | ICD-10-CM | POA: Diagnosis not present

## 2020-06-26 DIAGNOSIS — E786 Lipoprotein deficiency: Secondary | ICD-10-CM | POA: Diagnosis not present

## 2020-06-27 NOTE — Progress Notes (Signed)
Chief Complaint:   OBESITY Danny [name] is here to discuss his progress with his obesity treatment plan along with follow-up of his obesity related diagnoses. Danny [name] is on the Category 4 Plan and states he is following his eating plan approximately 90% of the time. Danny [name] states he is walking for 30 minutes 2 times per week.  Today's visit was #: 3 Starting weight: 292 lbs Starting date: 05/24/2020 Today's weight: 276 lbs Today's date: 06/26/2020 Total lbs lost to date: 16 lbs Total lbs lost since last in-office visit: 8 lbs  Interim History: Danny [name] is down an additional 8 pounds since his last visit and doing well overall.  He has been struggling to stay adherent to the plan.  Subjective:   1. Prediabetes Danny [name] has a diagnosis of prediabetes based on his elevated HgA1c and was informed this puts him at greater risk of developing diabetes. He continues to work on diet and exercise to decrease his risk of diabetes. He denies nausea or hypoglycemia.  He denies polyphagia.   Lab Results  Component Value Date   HGBA1C 6.2 (H) 05/24/2020   Lab Results  Component Value Date   INSULIN 23.1 05/24/2020   2. Low HDL (under 40) He is on no medication for cholesterol.  Lab Results  Component Value Date   ALT 30 05/24/2020   AST 21 05/24/2020   ALKPHOS 82 05/24/2020   BILITOT 0.5 05/24/2020   Lab Results  Component Value Date   CHOL 159 05/24/2020   HDL 36 (L) 05/24/2020   LDLCALC 105 (H) 05/24/2020   TRIG 94 05/24/2020   CHOLHDL 4 03/28/2020   Assessment/Plan:   1. Prediabetes Danny [name] will continue to work on weight loss, exercise, and decreasing simple carbohydrates to help decrease the risk of diabetes.  Continue to decrease carbohydrates, increase healthy proteins and fats.  Continue to increase activity.  2. Low HDL (under 40) Cardiovascular risk and specific lipid/LDL goals reviewed.  We discussed several lifestyle modifications today and Danny [name] will continue to  work on diet, exercise and weight loss efforts. Orders and follow up as documented in patient record.  Decrease carbohydrates and increase activity.  Counseling Intensive lifestyle modifications are the first line treatment for this issue. . Dietary changes: Increase soluble fiber. Decrease simple carbohydrates. . Exercise changes: Moderate to vigorous-intensity aerobic activity 150 minutes per week if tolerated. . Lipid-lowering medications: see documented in medical record.  3. Class 1 obesity with serious comorbidity and body mass index (BMI) of 32.0 to 32.9 in adult, unspecified obesity type  Danny [name] is currently in the action stage of change. As such, his goal is to continue with weight loss efforts. He has agreed to the Category 4 Plan.   He will work on mal planning and will remain adherent to the plan.  Exercise goals: Walking and will go to the gym.  Behavioral modification strategies: increasing lean protein intake, decreasing simple carbohydrates, increasing vegetables, increasing water intake, decreasing eating out, no skipping meals, meal planning and cooking strategies, keeping healthy foods in the home and planning for success.  Danny [name] has agreed to follow-up with our clinic in 3 weeks. He was informed of the importance of frequent follow-up visits to maximize his success with intensive lifestyle modifications for his multiple health conditions.   Objective:   Blood pressure 125/80, pulse 75, temperature (!) 97.5 F (36.4 C), height 6\' 5"  (1.956 m), weight 276 lb (125.2 kg), SpO2 95 %. Body mass index is 32.73 kg/m.  General:  Cooperative, alert, well developed, in no acute distress. HEENT: Conjunctivae and lids unremarkable. Cardiovascular: Regular rhythm.  Lungs: Normal work of breathing. Neurologic: No focal deficits.   Lab Results  Component Value Date   CREATININE 0.77 05/24/2020   BUN 11 05/24/2020   NA 142 05/24/2020   K 4.1 05/24/2020   CL 102 05/24/2020    CO2 27 05/24/2020   Lab Results  Component Value Date   ALT 30 05/24/2020   AST 21 05/24/2020   ALKPHOS 82 05/24/2020   BILITOT 0.5 05/24/2020   Lab Results  Component Value Date   HGBA1C 6.2 (H) 05/24/2020   HGBA1C 6.4 03/28/2020   Lab Results  Component Value Date   INSULIN 23.1 05/24/2020   Lab Results  Component Value Date   TSH 0.867 05/24/2020   Lab Results  Component Value Date   CHOL 159 05/24/2020   HDL 36 (L) 05/24/2020   LDLCALC 105 (H) 05/24/2020   TRIG 94 05/24/2020   CHOLHDL 4 03/28/2020   Lab Results  Component Value Date   WBC 7.7 05/24/2020   HGB 16.4 05/24/2020   HCT 48.8 05/24/2020   MCV 87 05/24/2020   PLT 293 05/24/2020   Attestation Statements:   Reviewed by clinician on day of visit: allergies, medications, problem list, medical history, surgical history, family history, social history, and previous encounter notes.  Time spent on visit including pre-visit chart review and post-visit care and charting was 20 minutes.   I, Insurance claims handler, CMA, am acting as Energy manager for [employer], DO  I have reviewed the above documentation for accuracy and completeness, and I agree with the above. Corinna Capra, DO

## 2020-06-28 ENCOUNTER — Encounter (INDEPENDENT_AMBULATORY_CARE_PROVIDER_SITE_OTHER): Payer: Self-pay | Admitting: Bariatrics

## 2020-06-28 NOTE — Telephone Encounter (Signed)
Dr.Brown 

## 2020-06-29 NOTE — Telephone Encounter (Signed)
Please review

## 2020-07-11 NOTE — Telephone Encounter (Signed)
Please let me know when I can reply to patient regarding his 3/21 scheduled appt.  Thank you

## 2020-07-12 ENCOUNTER — Encounter (INDEPENDENT_AMBULATORY_CARE_PROVIDER_SITE_OTHER): Payer: Self-pay

## 2020-07-12 NOTE — Telephone Encounter (Signed)
Please review

## 2020-07-17 ENCOUNTER — Ambulatory Visit (INDEPENDENT_AMBULATORY_CARE_PROVIDER_SITE_OTHER): Payer: Managed Care, Other (non HMO) | Admitting: Bariatrics

## 2021-03-29 ENCOUNTER — Ambulatory Visit (INDEPENDENT_AMBULATORY_CARE_PROVIDER_SITE_OTHER): Payer: Managed Care, Other (non HMO) | Admitting: Internal Medicine

## 2021-03-29 ENCOUNTER — Encounter: Payer: Self-pay | Admitting: Internal Medicine

## 2021-03-29 VITALS — BP 126/66 | HR 69 | Temp 98.4°F | Resp 16 | Ht 77.0 in | Wt 272.1 lb

## 2021-03-29 DIAGNOSIS — Z0000 Encounter for general adult medical examination without abnormal findings: Secondary | ICD-10-CM

## 2021-03-29 DIAGNOSIS — E559 Vitamin D deficiency, unspecified: Secondary | ICD-10-CM

## 2021-03-29 DIAGNOSIS — Z23 Encounter for immunization: Secondary | ICD-10-CM

## 2021-03-29 DIAGNOSIS — R739 Hyperglycemia, unspecified: Secondary | ICD-10-CM | POA: Diagnosis not present

## 2021-03-29 LAB — HEMOGLOBIN A1C: Hgb A1c MFr Bld: 5.9 % (ref 4.6–6.5)

## 2021-03-29 LAB — COMPREHENSIVE METABOLIC PANEL
ALT: 20 U/L (ref 0–53)
AST: 14 U/L (ref 0–37)
Albumin: 4.5 g/dL (ref 3.5–5.2)
Alkaline Phosphatase: 64 U/L (ref 39–117)
BUN: 18 mg/dL (ref 6–23)
CO2: 32 mEq/L (ref 19–32)
Calcium: 9.5 mg/dL (ref 8.4–10.5)
Chloride: 101 mEq/L (ref 96–112)
Creatinine, Ser: 0.81 mg/dL (ref 0.40–1.50)
GFR: 97.19 mL/min (ref >60.00)
Glucose, Bld: 102 mg/dL — ABNORMAL HIGH (ref 70–99)
Potassium: 4.1 mEq/L (ref 3.5–5.1)
Sodium: 140 mEq/L (ref 135–145)
Total Bilirubin: 0.6 mg/dL (ref 0.2–1.2)
Total Protein: 7.1 g/dL (ref 6.0–8.3)

## 2021-03-29 LAB — CBC WITH DIFFERENTIAL/PLATELET
Basophils Absolute: 0 10*3/uL (ref 0.0–0.1)
Basophils Relative: 0.2 % (ref 0.0–3.0)
Eosinophils Absolute: 0 10*3/uL (ref 0.0–0.7)
Eosinophils Relative: 0.1 % (ref 0.0–5.0)
HCT: 44.9 % (ref 39.0–52.0)
Hemoglobin: 14.8 g/dL (ref 13.0–17.0)
Lymphocytes Relative: 12.8 % (ref 12.0–46.0)
Lymphs Abs: 1.5 10*3/uL (ref 0.7–4.0)
MCHC: 33 g/dL (ref 30.0–36.0)
MCV: 89.1 fl (ref 78.0–100.0)
Monocytes Absolute: 0.9 10*3/uL (ref 0.1–1.0)
Monocytes Relative: 7.9 % (ref 3.0–12.0)
Neutro Abs: 9.1 10*3/uL — ABNORMAL HIGH (ref 1.4–7.7)
Neutrophils Relative %: 79 % — ABNORMAL HIGH (ref 43.0–77.0)
Platelets: 301 10*3/uL (ref 150.0–400.0)
RBC: 5.04 Mil/uL (ref 4.22–5.81)
RDW: 13.6 % (ref 11.5–15.5)
WBC: 11.5 10*3/uL — ABNORMAL HIGH (ref 4.0–10.5)

## 2021-03-29 LAB — LIPID PANEL
Cholesterol: 163 mg/dL (ref 0–200)
HDL: 44.1 mg/dL (ref >39.00)
LDL Cholesterol: 101 mg/dL — ABNORMAL HIGH (ref 0–99)
NonHDL: 118.48
Total CHOL/HDL Ratio: 4
Triglycerides: 87 mg/dL (ref 0.0–149.0)
VLDL: 17.4 mg/dL (ref 0.0–40.0)

## 2021-03-29 LAB — VITAMIN D 25 HYDROXY (VIT D DEFICIENCY, FRACTURES): VITD: 25.36 ng/mL — ABNORMAL LOW (ref 30.00–100.00)

## 2021-03-29 NOTE — Patient Instructions (Signed)
Take over-the-counter vitamin D: Between 1000 and 2000 units daily  GO TO THE LAB : Get the blood work     GO TO THE FRONT DESK, PLEASE SCHEDULE YOUR APPOINTMENTS Come back for a checkup in 6 months

## 2021-03-29 NOTE — Assessment & Plan Note (Signed)
-  Tdap 03/2021 - Zostavax 2014; Shingrix x 2 - s/p covid vax, bivalent     -Had a flu shot - FH Colon cancer, pt reports a Cscope w/ Dr Candelaria Stagers 2007,also c-scopes on  07-2014 and 10/14/2019 (3 years per pt) --Prostate ca screening : DRE and PSA normal last year. - Labs: CMP, FLP, CBC, A1c, vit d  -Diet and exercise doing well.  Has lost some weight.

## 2021-03-29 NOTE — Assessment & Plan Note (Signed)
Here for CPX Hyperglycemia: A1c was 6.4 last year, working on diet.  Recheck today Gout: On allopurinol, no recent episodes. 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  Patient aware. Vitamin D deficiency: Currently on multivitamin some days, recommend vitamin D supplements daily.  Checking levels. RTC 6 months

## 2021-03-29 NOTE — Progress Notes (Signed)
Subjective:    Patient ID: Danny [name], male    DOB: 1961/02/07, 60 y.o.   MRN: [id number]  DOS:  03/29/2021 Type of visit - description: CPX  Since the last office visit is doing well. 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 Has lost some weight.  Wt Readings from Last 3 Encounters:  03/29/21 272 lb 2 oz (123.4 kg)  06/26/20 276 lb (125.2 kg)  06/07/20 284 lb (128.8 kg)     Review of Systems  Other than above, a 14 point review of systems is negative      Past Medical History:  Diagnosis Date   Acute tear medial meniscus 2017   Effusion, left knee 2017   Epiploic appendagitis 2007   Gout    Internal derangement of knee joint, left 2017   Synovitis of left knee 2017    Past Surgical History:  Procedure Laterality Date   KNEE ARTHROSCOPY WITH LATERAL MENISECTOMY Left 04/2015   Social History   Socioeconomic History   Marital status: Married    Spouse name: Tammy   Number of children: 2   Years of education: Not on file   Highest education level: Not on file  Occupational History   Occupation: Property Management  Tobacco Use   Smoking status: Never   Smokeless tobacco: Never  Substance and Sexual Activity   Alcohol use: Yes    Alcohol/week: 1.0 - 2.0 standard drink    Types: 1 - 2 Standard drinks or equivalent per week    Comment: socially   Drug use: No   Sexual activity: Not on file  Other Topics Concern   Not on file  Social History Narrative    Lives w/ wife   Social Determinants of Health   Financial Resource Strain: Not on file  Food Insecurity: Not on file  Transportation Needs: Not on file  Physical Activity: Not on file  Stress: Not on file  Social Connections: Not on file  Intimate Partner Violence: Not on file     Allergies as of 03/29/2021       Reactions   Penicillins    Other reaction(s): Other (See Comments) CHILDHOOD REACTION        Medication List        Accurate as of March 29, 2021  5:13 PM. If you have any questions,  ask your nurse or doctor.          STOP taking these medications    Vitamin D (Ergocalciferol) 1.25 MG (50000 UNIT) Caps capsule Commonly known as: DRISDOL Stopped by: Willow Ora, MD       TAKE these medications    allopurinol 300 MG tablet Commonly known as: ZYLOPRIM Take 1 tablet (300 mg total) by mouth daily.   aspirin 81 MG tablet Take 81 mg by mouth daily.   multivitamin with minerals Tabs tablet Take 1 tablet by mouth daily.   vitamin B-12 250 MCG tablet Commonly known as: CYANOCOBALAMIN Take 250 mcg by mouth daily.   vitamin C 1000 MG tablet Take 1,000 mg by mouth.           Objective:   Physical Exam BP 126/66 (BP Location: Left Arm, Patient Position: Sitting, Cuff Size: Normal)   Pulse 69   Temp 98.4 F (36.9 C) (Oral)   Resp 16   Ht 6\' 5"  (1.956 m)   Wt 272 lb 2 oz (123.4 kg)   SpO2 96%   BMI 32.27 kg/m  General: Well developed, NAD, BMI noted  Neck: No  thyromegaly  HEENT:  Normocephalic . Face symmetric, atraumatic Lungs:  CTA B Normal respiratory effort, no intercostal retractions, no accessory muscle use. Heart: RRR,  no murmur.  Abdomen:  Not distended, soft, non-tender. No rebound or rigidity.   Lower extremities: no pretibial edema bilaterally  Skin: Exposed areas without rash. Not pale. Not jaundice Neurologic:  alert & oriented X3.  Speech normal, gait appropriate for age and unassisted Strength symmetric and appropriate for age.  Psych: Cognition and judgment appear intact.  Cooperative with normal attention span and concentration.  Behavior appropriate. No anxious or depressed appearing.     Assessment    Assessment  Hyperglycemia Gout  H/o Epiploic appendagitis  OSA per sleep study 10/2016, mild rx cpap or dental devise + FH CAD father age 27s  PLAN: Here for CPX Hyperglycemia: A1c was 6.4 last year, working on diet.  Recheck today Gout: On allopurinol, no recent episodes. DJD: Reports severe knee DJD,  contemplating bilateral total knee replacement next year. 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  Patient aware. Vitamin D deficiency: Currently on multivitamin some days, recommend vitamin D supplements daily.  Checking levels. RTC 6 months     This visit occurred during the SARS-CoV-2 public health emergency.  Safety protocols were in place, including screening questions prior to the visit, additional usage of staff PPE, and extensive cleaning of exam room while observing appropriate contact time as indicated for disinfecting solutions.

## 2021-04-02 MED ORDER — VITAMIN D (ERGOCALCIFEROL) 1.25 MG (50000 UNIT) PO CAPS: 50000.0000 [IU] | ORAL_CAPSULE | ORAL | 0 refills | Status: DC

## 2021-04-02 NOTE — Addendum Note (Signed)
Addended byConrad  D on: 04/02/2021 01:00 PM   Modules accepted: Orders

## 2021-05-22 ENCOUNTER — Encounter: Payer: Self-pay | Admitting: Internal Medicine

## 2021-05-22 ENCOUNTER — Ambulatory Visit: Payer: Managed Care, Other (non HMO) | Admitting: Internal Medicine

## 2021-05-22 VITALS — BP 122/90 | HR 90 | Temp 98.0°F | Resp 16 | Ht 77.0 in | Wt 272.1 lb

## 2021-05-22 DIAGNOSIS — M542 Cervicalgia: Secondary | ICD-10-CM

## 2021-05-22 DIAGNOSIS — H6121 Impacted cerumen, right ear: Secondary | ICD-10-CM

## 2021-05-22 NOTE — Progress Notes (Signed)
° °  Subjective:    Patient ID: Danny [name], male    DOB: 05/04/1960, 61 y.o.   MRN: [id number]  DOS:  05/22/2021 Type of visit - description: acute  Yesterday developed pain at the left anterior neck mostly with swallowing. Denies fever chills.  No dental pain.  No recent URI.  No GERD. There was actually no swelling just soreness when he swallows.  No TTP.  Also feels like he has a lot of wax in the ears.  Denies otalgia per se.  Review of Systems See above   Past Medical History:  Diagnosis Date   Acute tear medial meniscus 2017   Effusion, left knee 2017   Epiploic appendagitis 2007   Gout    Internal derangement of knee joint, left 2017   Synovitis of left knee 2017    Past Surgical History:  Procedure Laterality Date   KNEE ARTHROSCOPY WITH LATERAL MENISECTOMY Left 04/2015    Current Outpatient Medications  Medication Instructions   allopurinol (ZYLOPRIM) 300 mg, Oral, Daily   aspirin 81 mg, Oral, Daily,     Multiple Vitamin (MULTIVITAMIN WITH MINERALS) TABS tablet 1 tablet, Oral, Daily   vitamin B-12 (CYANOCOBALAMIN) 250 mcg, Oral, Daily   vitamin C 1,000 mg, Oral   Vitamin D (Ergocalciferol) (DRISDOL) 50,000 Units, Oral, Every 7 days       Objective:   Physical Exam BP 122/90 (BP Location: Left Arm, Patient Position: Sitting, Cuff Size: Normal)    Pulse 90    Temp 98 F (36.7 C) (Oral)    Resp 16    Ht 6\' 5"  (1.956 m)    Wt 272 lb 2 oz (123.4 kg)    SpO2 98%    BMI 32.27 kg/m  General:   Well developed, NAD, BMI noted. HEENT:  Normocephalic . Face symmetric, atraumatic.  Throat: Normal to inspection Ears: Bilateral cerumen impaction Neck: No thyromegaly, no lymphadenopathies at the neck or supraclavicular areas. Area of concern for the patient, left side of the neck anteriorly, normal to palpation with symmetric and nontender sub maxillary salivary glands. Lower extremities: no pretibial edema bilaterally  Skin: Not pale. Not jaundice Neurologic:   alert & oriented X3.  Speech normal, gait appropriate for age and unassisted Psych--  Cognition and judgment appear intact.  Cooperative with normal attention span and concentration.  Behavior appropriate. No anxious or depressed appearing.      Assessment      Assessment  Hyperglycemia Gout  H/o Epiploic appendagitis  OSA per sleep study 10/2016, mild rx cpap or dental devise + FH CAD father age 46s  PLAN: Pain, L anterior neck: sxs as described above, they self resolve within 24 hours, exam is benign.  Recommend observation.  Call if symptoms return Cerumen impaction: With the patient verbal consent I remove abundant wax from both ears. Subsequently lavage was done on the R side.  We obtained abundant cerumen.  After the lavage there was some residual wax that we could not get.  TM normal. Plan: Peroxide once or twice a week, call if he develops pain or swelling on the right ear.    This visit occurred during the SARS-CoV-2 public health emergency.  Safety protocols were in place, including screening questions prior to the visit, additional usage of staff PPE, and extensive cleaning of exam room while observing appropriate contact time as indicated for disinfecting solutions.

## 2021-05-22 NOTE — Patient Instructions (Signed)
To prevent wax accumulation, place some peroxide drops at the ears once or twice a week.  We lavaged the right ear today, if you develop pain or swelling please let us know

## 2021-05-22 NOTE — Assessment & Plan Note (Signed)
Pain, L anterior neck: sxs as described above, they self resolve within 24 hours, exam is benign.  Recommend observation.  Call if symptoms return Cerumen impaction: With the patient verbal consent I remove abundant wax from both ears. Subsequently lavage was done on the R side.  We obtained abundant cerumen.  After the lavage there was some residual wax that we could not get.  TM normal. Plan: Peroxide once or twice a week, call if he develops pain or swelling on the right ear.

## 2021-09-25 ENCOUNTER — Telehealth: Payer: Self-pay | Admitting: Internal Medicine

## 2021-09-25 DIAGNOSIS — E559 Vitamin D deficiency, unspecified: Secondary | ICD-10-CM

## 2021-09-25 DIAGNOSIS — Z8249 Family history of ischemic heart disease and other diseases of the circulatory system: Secondary | ICD-10-CM

## 2021-09-25 DIAGNOSIS — Z125 Encounter for screening for malignant neoplasm of prostate: Secondary | ICD-10-CM

## 2021-09-25 DIAGNOSIS — M109 Gout, unspecified: Secondary | ICD-10-CM

## 2021-09-25 DIAGNOSIS — R739 Hyperglycemia, unspecified: Secondary | ICD-10-CM

## 2021-09-25 NOTE — Telephone Encounter (Signed)
CMP, FLP, CBC, A1c, PSA, uric acid, vitamin D Dx hyperglycemia, gout, vitamin D deficiency, FH CAD

## 2021-09-25 NOTE — Telephone Encounter (Signed)
Please advise 

## 2021-09-25 NOTE — Telephone Encounter (Signed)
Pt called wondering if blood work needed to be done and if so could it be done before his scheduled appt.

## 2021-09-26 NOTE — Telephone Encounter (Signed)
Pt scheduled for labs 4 business days prior to OV w/ Dr. Drue Novel

## 2021-09-26 NOTE — Telephone Encounter (Signed)
Orders have been placed- will you schedule Danny [name] for the lab several days prior to his visit on 10/29/21 w/ Dr. Drue Novel? He will need to fast for labs. Thank you.

## 2021-09-27 ENCOUNTER — Ambulatory Visit: Payer: Managed Care, Other (non HMO) | Admitting: Internal Medicine

## 2021-10-23 ENCOUNTER — Other Ambulatory Visit: Payer: Self-pay

## 2021-10-24 ENCOUNTER — Other Ambulatory Visit (INDEPENDENT_AMBULATORY_CARE_PROVIDER_SITE_OTHER): Payer: BC Managed Care – PPO

## 2021-10-24 DIAGNOSIS — Z8249 Family history of ischemic heart disease and other diseases of the circulatory system: Secondary | ICD-10-CM | POA: Diagnosis not present

## 2021-10-24 DIAGNOSIS — M109 Gout, unspecified: Secondary | ICD-10-CM | POA: Diagnosis not present

## 2021-10-24 DIAGNOSIS — R739 Hyperglycemia, unspecified: Secondary | ICD-10-CM

## 2021-10-24 DIAGNOSIS — Z125 Encounter for screening for malignant neoplasm of prostate: Secondary | ICD-10-CM

## 2021-10-24 DIAGNOSIS — E559 Vitamin D deficiency, unspecified: Secondary | ICD-10-CM

## 2021-10-24 LAB — CBC WITH DIFFERENTIAL/PLATELET
Basophils Absolute: 0.1 10*3/uL (ref 0.0–0.1)
Basophils Relative: 0.8 % (ref 0.0–3.0)
Eosinophils Absolute: 0.1 10*3/uL (ref 0.0–0.7)
Eosinophils Relative: 1.8 % (ref 0.0–5.0)
HCT: 45 % (ref 39.0–52.0)
Hemoglobin: 15 g/dL (ref 13.0–17.0)
Lymphocytes Relative: 21.6 % (ref 12.0–46.0)
Lymphs Abs: 1.8 10*3/uL (ref 0.7–4.0)
MCHC: 33.2 g/dL (ref 30.0–36.0)
MCV: 88.8 fl (ref 78.0–100.0)
Monocytes Absolute: 0.9 10*3/uL (ref 0.1–1.0)
Monocytes Relative: 10.9 % (ref 3.0–12.0)
Neutro Abs: 5.3 10*3/uL (ref 1.4–7.7)
Neutrophils Relative %: 64.9 % (ref 43.0–77.0)
Platelets: 284 10*3/uL (ref 150.0–400.0)
RBC: 5.07 Mil/uL (ref 4.22–5.81)
RDW: 13.5 % (ref 11.5–15.5)
WBC: 8.2 10*3/uL (ref 4.0–10.5)

## 2021-10-24 LAB — COMPREHENSIVE METABOLIC PANEL
ALT: 28 U/L (ref 0–53)
AST: 18 U/L (ref 0–37)
Albumin: 4.2 g/dL (ref 3.5–5.2)
Alkaline Phosphatase: 63 U/L (ref 39–117)
BUN: 13 mg/dL (ref 6–23)
CO2: 29 mEq/L (ref 19–32)
Calcium: 9.1 mg/dL (ref 8.4–10.5)
Chloride: 101 mEq/L (ref 96–112)
Creatinine, Ser: 0.77 mg/dL (ref 0.40–1.50)
GFR: 98.29 mL/min (ref >60.00)
Glucose, Bld: 100 mg/dL — ABNORMAL HIGH (ref 70–99)
Potassium: 4.1 mEq/L (ref 3.5–5.1)
Sodium: 138 mEq/L (ref 135–145)
Total Bilirubin: 0.7 mg/dL (ref 0.2–1.2)
Total Protein: 6.9 g/dL (ref 6.0–8.3)

## 2021-10-24 LAB — PSA: PSA: 1.13 ng/mL (ref 0.10–4.00)

## 2021-10-24 LAB — VITAMIN D 25 HYDROXY (VIT D DEFICIENCY, FRACTURES): VITD: 34.83 ng/mL (ref 30.00–100.00)

## 2021-10-24 LAB — LIPID PANEL
Cholesterol: 162 mg/dL (ref 0–200)
HDL: 41.9 mg/dL (ref >39.00)
LDL Cholesterol: 100 mg/dL — ABNORMAL HIGH (ref 0–99)
NonHDL: 119.88
Total CHOL/HDL Ratio: 4
Triglycerides: 98 mg/dL (ref 0.0–149.0)
VLDL: 19.6 mg/dL (ref 0.0–40.0)

## 2021-10-24 LAB — HEMOGLOBIN A1C: Hgb A1c MFr Bld: 6 % (ref 4.6–6.5)

## 2021-10-24 LAB — URIC ACID: Uric Acid, Serum: 8.2 mg/dL — ABNORMAL HIGH (ref 4.0–7.8)

## 2021-10-29 ENCOUNTER — Ambulatory Visit (INDEPENDENT_AMBULATORY_CARE_PROVIDER_SITE_OTHER): Payer: BC Managed Care – PPO | Admitting: Internal Medicine

## 2021-10-29 ENCOUNTER — Encounter: Payer: Self-pay | Admitting: Internal Medicine

## 2021-10-29 VITALS — BP 124/76 | HR 98 | Temp 97.8°F | Resp 16 | Ht 77.0 in | Wt 281.5 lb

## 2021-10-29 DIAGNOSIS — E669 Obesity, unspecified: Secondary | ICD-10-CM | POA: Diagnosis not present

## 2021-10-29 DIAGNOSIS — Z6833 Body mass index (BMI) 33.0-33.9, adult: Secondary | ICD-10-CM

## 2021-10-29 DIAGNOSIS — M109 Gout, unspecified: Secondary | ICD-10-CM | POA: Diagnosis not present

## 2021-10-29 DIAGNOSIS — E559 Vitamin D deficiency, unspecified: Secondary | ICD-10-CM | POA: Diagnosis not present

## 2021-10-29 MED ORDER — INDOMETHACIN 25 MG PO CAPS
25.0000 mg | ORAL_CAPSULE | Freq: Two times a day (BID) | ORAL | Status: AC
Start: 2021-10-29 — End: ?

## 2021-10-29 NOTE — Assessment & Plan Note (Signed)
Labs from 10/24/2021 reviewed with Hyperglycemia: Last A1c very good at 6.0.  Cholesterol is also satisfactory. Obesity, weight gain: We had a discussion about diet exercise.  Recommend to decrease caloric intake, also routine exercise. Vitamin D deficiency: h/o low vitamin D, s/p  ergocalciferol, vitamin D came back better, now on OTCs. Preventive care: Plans to get a flu shot this fall, will think about a COVID booster. RTC 6 months CPX

## 2021-10-29 NOTE — Patient Instructions (Signed)
   GO TO THE FRONT DESK, PLEASE SCHEDULE YOUR APPOINTMENTS Come back for  a physical in 6 months  

## 2021-10-29 NOTE — Progress Notes (Addendum)
   Subjective:    Patient ID: Danny [name], male    DOB: 29-Oct-1960, 61 y.o.   MRN: [id number]  DOS:  10/29/2021 Type of visit - description: Routine checkup  Since the last office visit, he stopped taking allopurinol ("I keep forgetting") but then went back on it prescribed by podiatry.  For a while had problems with feet aches pain and swelling. Took  indometacin without GI side effects and symptoms are better.  Also, is concerned about weight gain. Admits he is not doing well with diet and exercise  Wt Readings from Last 3 Encounters:  10/29/21 281 lb 8 oz (127.7 kg)  05/22/21 272 lb 2 oz (123.4 kg)  03/29/21 272 lb 2 oz (123.4 kg)    Review of Systems See above   Past Medical History:  Diagnosis Date   Acute tear medial meniscus 2017   Effusion, left knee 2017   Epiploic appendagitis 2007   Gout    Internal derangement of knee joint, left 2017   Synovitis of left knee 2017    Past Surgical History:  Procedure Laterality Date   KNEE ARTHROSCOPY WITH LATERAL MENISECTOMY Left 04/2015    Current Outpatient Medications  Medication Instructions   allopurinol (ZYLOPRIM) 300 mg, Oral, Daily   aspirin 81 mg, Oral, Daily,     Multiple Vitamin (MULTIVITAMIN WITH MINERALS) TABS tablet 1 tablet, Oral, Daily   vitamin B-12 (CYANOCOBALAMIN) 250 mcg, Oral, Daily   vitamin C 1,000 mg, Oral   Vitamin D (Ergocalciferol) (DRISDOL) 50,000 Units, Oral, Every 7 days   VITAMIN D, CHOLECALCIFEROL, PO Oral       Objective:   Physical Exam BP 124/76   Pulse 98   Temp 97.8 F (36.6 C) (Oral)   Resp 16   Ht 6\' 5"  (1.956 m)   Wt 281 lb 8 oz (127.7 kg)   SpO2 96%   BMI 33.38 kg/m  General:   Well developed, NAD, BMI noted. HEENT:  Normocephalic . Face symmetric, atraumatic Lungs:  CTA B Normal respiratory effort, no intercostal retractions, no accessory muscle use. Heart: RRR,  no murmur.  Lower extremities: no pretibial edema bilaterally  Skin: Not pale. Not  jaundice Neurologic:  alert & oriented X3.  Speech normal, gait appropriate for age and unassisted Psych--  Cognition and judgment appear intact.  Cooperative with normal attention span and concentration.  Behavior appropriate. No anxious or depressed appearing.      Assessment    Assessment  Hyperglycemia Gout  H/o Epiploic appendagitis  OSA per sleep study 10/2016, mild rx cpap or dental devise + FH CAD father age 19s  PLAN: Labs from 10/24/2021 reviewed with Hyperglycemia: Last A1c very good at 6.0.  Cholesterol is also satisfactory. Obesity, weight gain: We had a discussion about diet exercise.  Recommend to decrease caloric intake, also routine exercise. Vitamin D deficiency: h/o low vitamin D, s/p  ergocalciferol, vitamin D came back better, now on OTCs. Preventive care: Plans to get a flu shot this fall, will think about a COVID booster. RTC 6 months CPX

## 2021-12-05 ENCOUNTER — Encounter (INDEPENDENT_AMBULATORY_CARE_PROVIDER_SITE_OTHER): Payer: Self-pay

## 2022-04-16 ENCOUNTER — Other Ambulatory Visit (INDEPENDENT_AMBULATORY_CARE_PROVIDER_SITE_OTHER): Payer: BC Managed Care – PPO

## 2022-04-16 ENCOUNTER — Ambulatory Visit (INDEPENDENT_AMBULATORY_CARE_PROVIDER_SITE_OTHER): Payer: BC Managed Care – PPO | Admitting: Internal Medicine

## 2022-04-16 ENCOUNTER — Encounter: Payer: Self-pay | Admitting: Internal Medicine

## 2022-04-16 VITALS — BP 126/70 | HR 84 | Temp 98.1°F | Resp 18 | Ht 77.0 in | Wt 285.0 lb

## 2022-04-16 DIAGNOSIS — R739 Hyperglycemia, unspecified: Secondary | ICD-10-CM | POA: Diagnosis not present

## 2022-04-16 DIAGNOSIS — Z0000 Encounter for general adult medical examination without abnormal findings: Secondary | ICD-10-CM

## 2022-04-16 DIAGNOSIS — M109 Gout, unspecified: Secondary | ICD-10-CM

## 2022-04-16 LAB — COMPREHENSIVE METABOLIC PANEL
ALT: 30 U/L (ref 0–53)
AST: 18 U/L (ref 0–37)
Albumin: 4.3 g/dL (ref 3.5–5.2)
Alkaline Phosphatase: 66 U/L (ref 39–117)
BUN: 14 mg/dL (ref 6–23)
CO2: 29 mEq/L (ref 19–32)
Calcium: 9.4 mg/dL (ref 8.4–10.5)
Chloride: 102 mEq/L (ref 96–112)
Creatinine, Ser: 0.82 mg/dL (ref 0.40–1.50)
GFR: 96.12 mL/min (ref >60.00)
Glucose, Bld: 99 mg/dL (ref 70–99)
Potassium: 3.9 mEq/L (ref 3.5–5.1)
Sodium: 140 mEq/L (ref 135–145)
Total Bilirubin: 0.8 mg/dL (ref 0.2–1.2)
Total Protein: 7 g/dL (ref 6.0–8.3)

## 2022-04-16 LAB — LIPID PANEL
Cholesterol: 160 mg/dL (ref 0–200)
HDL: 37.8 mg/dL — ABNORMAL LOW (ref >39.00)
LDL Cholesterol: 99 mg/dL (ref 0–99)
NonHDL: 122.48
Total CHOL/HDL Ratio: 4
Triglycerides: 118 mg/dL (ref 0.0–149.0)
VLDL: 23.6 mg/dL (ref 0.0–40.0)

## 2022-04-16 LAB — HEMOGLOBIN A1C: Hgb A1c MFr Bld: 6 % (ref 4.6–6.5)

## 2022-04-16 LAB — PSA: PSA: 1.08 ng/mL (ref 0.10–4.00)

## 2022-04-16 LAB — URIC ACID: Uric Acid, Serum: 7.9 mg/dL — ABNORMAL HIGH (ref 4.0–7.8)

## 2022-04-16 NOTE — Assessment & Plan Note (Signed)
Here for CPX Hyperglycemia: Check A1c Gout: Checking uric acid levels, continue allopurinol, takes Indocin infrequently. DJD: Having bilateral knee pain, considering replacement at some point. Vitamin D deficiency: Last levels okay, continue supplements. Follow-up: 1 year, sooner if needed

## 2022-04-16 NOTE — Progress Notes (Signed)
Subjective:    Patient ID: Danny [name], male    DOB: 08-16-1960, 61 y.o.   MRN: [id number]  DOS:  04/16/2022 Type of visit - description: CPX  Here for CPX Having bilateral knee pain. No other symptoms or concerns  Wt Readings from Last 3 Encounters:  04/16/22 285 lb (129.3 kg)  10/29/21 281 lb 8 oz (127.7 kg)  05/22/21 272 lb 2 oz (123.4 kg)    Review of Systems  Other than above, a 14 point review of systems is negative    Past Medical History:  Diagnosis Date   Acute tear medial meniscus 2017   Effusion, left knee 2017   Epiploic appendagitis 2007   Gout    Internal derangement of knee joint, left 2017   Synovitis of left knee 2017    Past Surgical History:  Procedure Laterality Date   KNEE ARTHROSCOPY WITH LATERAL MENISECTOMY Left 04/2015   Social History   Socioeconomic History   Marital status: Married    Spouse name: Tammy   Number of children: 2   Years of education: Not on file   Highest education level: Not on file  Occupational History   Occupation: Property Management  Tobacco Use   Smoking status: Never   Smokeless tobacco: Never  Substance and Sexual Activity   Alcohol use: Yes    Alcohol/week: 1.0 - 2.0 standard drink of alcohol    Types: 1 - 2 Standard drinks or equivalent per week    Comment: socially   Drug use: No   Sexual activity: Not on file  Other Topics Concern   Not on file  Social History Narrative    Lives w/ wife   Social Determinants of Health   Financial Resource Strain: Not on file  Food Insecurity: Not on file  Transportation Needs: Not on file  Physical Activity: Not on file  Stress: Not on file  Social Connections: Not on file  Intimate Partner Violence: Not on file     Current Outpatient Medications  Medication Instructions   allopurinol (ZYLOPRIM) 300 mg, Oral, Daily   aspirin 81 mg, Oral, Daily,     indomethacin (INDOCIN) 25 mg, Oral, 2 times daily with meals   Multiple Vitamin (MULTIVITAMIN WITH  MINERALS) TABS tablet 1 tablet, Oral, Daily   vitamin B-12 (CYANOCOBALAMIN) 250 mcg, Daily   VITAMIN D, CHOLECALCIFEROL, PO Oral       Objective:   Physical Exam BP 126/70   Pulse 84   Temp 98.1 F (36.7 C) (Oral)   Resp 18   Ht 6\' 5"  (1.956 m)   Wt 285 lb (129.3 kg)   SpO2 95%   BMI 33.80 kg/m  General: Well developed, NAD, BMI noted Neck: No  thyromegaly  HEENT:  Normocephalic . Face symmetric, atraumatic Lungs:  CTA B Normal respiratory effort, no intercostal retractions, no accessory muscle use. Heart: RRR,  no murmur.  Abdomen:  Not distended, soft, non-tender. No rebound or rigidity.   Lower extremities: no pretibial edema bilaterally  Skin: Exposed areas without rash. Not pale. Not jaundice DRE: Normal sphincter tone, brown stool, prostate normal. Neurologic:  alert & oriented X3.  Speech normal, gait appropriate for age and unassisted but somewhat limited by knee DJD. Strength symmetric and appropriate for age.  Psych: Cognition and judgment appear intact.  Cooperative with normal attention span and concentration.  Behavior appropriate. No anxious or depressed appearing.     Assessment     Assessment  Hyperglycemia Gout  H/o  Epiploic appendagitis  OSA per sleep study 10/2016, mild rx cpap or dental devise Vitamin D deficiency + FH CAD father age 97s  PLAN: Here for CPX Hyperglycemia: Check A1c Gout: Checking uric acid levels, continue allopurinol, takes Indocin infrequently. DJD: Having bilateral knee pain, considering replacement at some point. Vitamin D deficiency: Last levels okay, continue supplements. Follow-up: 1 year, sooner if needed

## 2022-04-16 NOTE — Assessment & Plan Note (Signed)
-  Tdap 03/2021 - Zostavax 2014; Shingrix x 2 - covid vax booster recommended. -Had a flu shot - FH Colon cancer, pt reports a Cscope w/ Dr Candelaria Stagers 2007,also c-scopes on  07-2014 and 10/14/2019 (3 years per pt) --Prostate ca screening : No symptoms, DRE normal, check PSA. - Labs: CMP, FLP, A1c, uric acid, PSA -Diet and exercise: Weight gain noted, provided advised about diet unable to exercise much due to DJD - Healthcare POA discussed

## 2022-04-16 NOTE — Patient Instructions (Addendum)
Vaccines I recommend:  Covid booster     GO TO THE LAB : Get the blood work     GO TO THE FRONT DESK, PLEASE SCHEDULE YOUR APPOINTMENTS Come back for   a physical exam in 1 year     Do you have a "Living will" or "Health Care Power of attorney"? (Advance care planning documents)  If you already have a living will or healthcare power of attorney, is recommended you bring the copy to be scanned in your chart. The document will be available to all the doctors you see in the system.  If you don't have one, please consider create one.   Advance directives can be documented in many types of formats,  documents have names such as:  Lliving will  Durable power of attorney for healthcare (healthcare proxy or healthcare power of attorney)  Combined directives  Physician orders for life-sustaining treatment    More information at:  [email or web address]

## 2022-05-01 DIAGNOSIS — H524 Presbyopia: Secondary | ICD-10-CM | POA: Diagnosis not present

## 2022-05-01 DIAGNOSIS — D3131 Benign neoplasm of right choroid: Secondary | ICD-10-CM | POA: Diagnosis not present

## 2022-05-01 DIAGNOSIS — H5212 Myopia, left eye: Secondary | ICD-10-CM | POA: Diagnosis not present

## 2022-05-01 DIAGNOSIS — H43393 Other vitreous opacities, bilateral: Secondary | ICD-10-CM | POA: Diagnosis not present

## 2022-07-18 DIAGNOSIS — M170 Bilateral primary osteoarthritis of knee: Secondary | ICD-10-CM | POA: Diagnosis not present

## 2022-07-22 ENCOUNTER — Telehealth: Payer: Self-pay

## 2022-07-22 NOTE — Telephone Encounter (Signed)
Received surgical form from [facility]. Pt is needing L TKA w/ Dr. Gaynelle Arabian- scheduled 09/17/22. Will reach out to Pt to schedule surgical clearance.

## 2022-07-23 NOTE — Telephone Encounter (Signed)
Appt scheduled for 08/30/22

## 2022-07-23 NOTE — Telephone Encounter (Signed)
LVM for pt to call office and schedule an appt with PCP around 08-28-2022, appt is for Surgical clearance.

## 2022-07-23 NOTE — Telephone Encounter (Signed)
Kennyth Lose- can you reach out to Pt to schedule surgical clearance appt for around 08/28/22? He is scheduled for surgery on 09/17/22.

## 2022-08-26 DIAGNOSIS — M1712 Unilateral primary osteoarthritis, left knee: Secondary | ICD-10-CM | POA: Diagnosis not present

## 2022-08-28 HISTORY — PX: TOTAL KNEE ARTHROPLASTY: SHX125

## 2022-08-30 ENCOUNTER — Encounter: Payer: Self-pay | Admitting: Internal Medicine

## 2022-08-30 ENCOUNTER — Ambulatory Visit (INDEPENDENT_AMBULATORY_CARE_PROVIDER_SITE_OTHER): Payer: No Typology Code available for payment source | Admitting: Internal Medicine

## 2022-08-30 VITALS — BP 138/82 | HR 86 | Temp 98.9°F | Resp 18 | Ht 77.0 in | Wt 289.5 lb

## 2022-08-30 DIAGNOSIS — R739 Hyperglycemia, unspecified: Secondary | ICD-10-CM | POA: Diagnosis not present

## 2022-08-30 DIAGNOSIS — Z01818 Encounter for other preprocedural examination: Secondary | ICD-10-CM | POA: Diagnosis not present

## 2022-08-30 LAB — COMPREHENSIVE METABOLIC PANEL
ALT: 28 U/L (ref 0–53)
AST: 20 U/L (ref 0–37)
Albumin: 4.2 g/dL (ref 3.5–5.2)
Alkaline Phosphatase: 62 U/L (ref 39–117)
BUN: 12 mg/dL (ref 6–23)
CO2: 27 mEq/L (ref 19–32)
Calcium: 9.2 mg/dL (ref 8.4–10.5)
Chloride: 102 mEq/L (ref 96–112)
Creatinine, Ser: 0.77 mg/dL (ref 0.40–1.50)
GFR: 97.71 mL/min (ref >60.00)
Glucose, Bld: 92 mg/dL (ref 70–99)
Potassium: 3.8 mEq/L (ref 3.5–5.1)
Sodium: 140 mEq/L (ref 135–145)
Total Bilirubin: 0.7 mg/dL (ref 0.2–1.2)
Total Protein: 7 g/dL (ref 6.0–8.3)

## 2022-08-30 LAB — CBC WITH DIFFERENTIAL/PLATELET
Basophils Absolute: 0 10*3/uL (ref 0.0–0.1)
Basophils Relative: 0.6 % (ref 0.0–3.0)
Eosinophils Absolute: 0.1 10*3/uL (ref 0.0–0.7)
Eosinophils Relative: 1.7 % (ref 0.0–5.0)
HCT: 45.5 % (ref 39.0–52.0)
Hemoglobin: 15.5 g/dL (ref 13.0–17.0)
Lymphocytes Relative: 24.9 % (ref 12.0–46.0)
Lymphs Abs: 1.9 10*3/uL (ref 0.7–4.0)
MCHC: 34.1 g/dL (ref 30.0–36.0)
MCV: 88.2 fl (ref 78.0–100.0)
Monocytes Absolute: 0.9 10*3/uL (ref 0.1–1.0)
Monocytes Relative: 11.1 % (ref 3.0–12.0)
Neutro Abs: 4.8 10*3/uL (ref 1.4–7.7)
Neutrophils Relative %: 61.7 % (ref 43.0–77.0)
Platelets: 295 10*3/uL (ref 150.0–400.0)
RBC: 5.16 Mil/uL (ref 4.22–5.81)
RDW: 13.3 % (ref 11.5–15.5)
WBC: 7.7 10*3/uL (ref 4.0–10.5)

## 2022-08-30 LAB — HEMOGLOBIN A1C: Hgb A1c MFr Bld: 5.9 % (ref 4.6–6.5)

## 2022-08-30 NOTE — Progress Notes (Unsigned)
   Subjective:    Patient ID: Danny [name], male    DOB: [birth year], 62 y.o.   MRN: [id number]  DOS:  08/30/2022 Type of visit - description: Surgical clearance  The patient has severe DJD, went to see orthopedics, he needs TKR bilaterally but likes to proceed with the left side first.  Other than DJD he feels well. Denies chest pain no difficulty breathing No lower extremity edema He is able to go up and down stairs with the  limiting factor being pain.  Review of Systems See above   Past Medical History:  Diagnosis Date   Acute tear medial meniscus 2017   Effusion, left knee 2017   Epiploic appendagitis 2007   Gout    Internal derangement of knee joint, left 2017   Synovitis of left knee 2017    Past Surgical History:  Procedure Laterality Date   KNEE ARTHROSCOPY WITH LATERAL MENISECTOMY Left 04/2015    Current Outpatient Medications  Medication Instructions   allopurinol (ZYLOPRIM) 300 mg, Oral, Daily   aspirin 81 mg, Oral, Daily,     indomethacin (INDOCIN) 25 mg, Oral, 2 times daily with meals   Multiple Vitamin (MULTIVITAMIN WITH MINERALS) TABS tablet 1 tablet, Oral, Daily   vitamin B-12 (CYANOCOBALAMIN) 250 mcg, Daily   VITAMIN D, CHOLECALCIFEROL, PO Oral       Objective:   Physical Exam BP 138/82   Pulse 86   Temp 98.9 F (37.2 C) (Oral)   Resp 18   Ht 6\' 5"  (1.956 m)   Wt 289 lb 8 oz (131.3 kg)   SpO2 96%   BMI 34.33 kg/m  General:   Well developed, NAD, BMI noted. HEENT:  Normocephalic . Face symmetric, atraumatic Neck: No JVD at 45 degrees Lungs:  CTA B Normal respiratory effort, no intercostal retractions, no accessory muscle use. Heart: RRR,  no murmur.  Lower extremities: no pretibial edema bilaterally  Skin: Not pale. Not jaundice Neurologic:  alert & oriented X3.  Speech normal, gait appropriate for age and unassisted Psych--  Cognition and judgment appear intact.  Cooperative with normal attention span and concentration.  Behavior  appropriate. No anxious or depressed appearing.      Assessment     Assessment  Hyperglycemia Gout  H/o Epiploic appendagitis  OSA per sleep study 10/2016, mild rx cpap or dental devise Vitamin D deficiency + FH CAD father age 33s  PLAN Surgical clearance: The patient is 62 years old, no personal history of CAD, no cardiopulmonary symptoms, in need to L TKR. EKG today: NSR, no acute changes, no change from previous EKG. The patient is cleared for surgery, will check a CMP CBC.  He has hyperglycemia, will also check A1c. RTC 03/2023 for CPX    12==== Here for CPX Hyperglycemia: Check A1c Gout: Checking uric acid levels, continue allopurinol, takes Indocin infrequently. DJD: Having bilateral knee pain, considering replacement at some point. Vitamin D deficiency: Last levels okay, continue supplements. Follow-up: 1 year, sooner if needed

## 2022-08-30 NOTE — Patient Instructions (Addendum)
It was good to see you today  GO TO THE LAB : Get the blood work     GO TO THE FRONT DESK, PLEASE SCHEDULE YOUR APPOINTMENTS Come back for   a physical exam by December 2024

## 2022-08-31 NOTE — Assessment & Plan Note (Signed)
Surgical clearance: The patient is 62 years old, no personal history of CAD, no cardiopulmonary symptoms, in need to L TKR. EKG today: NSR, no acute changes, no change from previous EKG. The patient is cleared for surgery, will check a CMP CBC.  He has hyperglycemia, will also check A1c. RTC 03/2023 for CPX

## 2022-09-02 NOTE — Telephone Encounter (Signed)
Form completed and faxed to Attn: Aida Raider at [phone]. Form sent for scanning.

## 2022-09-02 NOTE — Telephone Encounter (Signed)
Received fax confirmation

## 2022-09-17 HISTORY — PX: KNEE ARTHROSCOPY: SUR90

## 2022-12-27 ENCOUNTER — Telehealth: Payer: Self-pay | Admitting: Internal Medicine

## 2022-12-27 DIAGNOSIS — Z01818 Encounter for other preprocedural examination: Secondary | ICD-10-CM

## 2022-12-27 NOTE — Telephone Encounter (Signed)
Pt states he is getting his other knee replaced and states the facility is okay with using his last clearance but he does need to get more labs. He will be dropping off the paper but would like to know if he can just come in for labs or if he needs a new ov. Please advise.

## 2022-12-27 NOTE — Telephone Encounter (Signed)
Please advise 

## 2022-12-27 NOTE — Telephone Encounter (Signed)
  No need for office visit if he is feeling well at the last surgery was not complicated. It is appropriate to do blood work w/o a office visit if needed

## 2022-12-27 NOTE — Telephone Encounter (Signed)
Noted, will wait for form.

## 2022-12-29 HISTORY — PX: TOTAL KNEE ARTHROPLASTY: SHX125

## 2022-12-31 NOTE — Telephone Encounter (Signed)
Spoke w/ Pt- per form- surgeon is requesting just a CMP and CBC w/ diff, Pt declines CP, SOB, states he is feeling well. Lab appt scheduled for Friday, 9/6 for him to have labs and to drop of surgery clearance form for PCP to complete.

## 2023-01-03 ENCOUNTER — Other Ambulatory Visit (INDEPENDENT_AMBULATORY_CARE_PROVIDER_SITE_OTHER): Payer: No Typology Code available for payment source

## 2023-01-03 ENCOUNTER — Other Ambulatory Visit: Payer: Self-pay

## 2023-01-03 DIAGNOSIS — Z01818 Encounter for other preprocedural examination: Secondary | ICD-10-CM

## 2023-01-03 DIAGNOSIS — R739 Hyperglycemia, unspecified: Secondary | ICD-10-CM

## 2023-01-03 LAB — COMPREHENSIVE METABOLIC PANEL WITH GFR
ALT: 26 U/L (ref 0–53)
AST: 18 U/L (ref 0–37)
Albumin: 4.1 g/dL (ref 3.5–5.2)
Alkaline Phosphatase: 68 U/L (ref 39–117)
BUN: 14 mg/dL (ref 6–23)
CO2: 30 meq/L (ref 19–32)
Calcium: 9.5 mg/dL (ref 8.4–10.5)
Chloride: 102 meq/L (ref 96–112)
Creatinine, Ser: 0.86 mg/dL (ref 0.40–1.50)
GFR: 94.27 mL/min
Glucose, Bld: 112 mg/dL — ABNORMAL HIGH (ref 70–99)
Potassium: 4 meq/L (ref 3.5–5.1)
Sodium: 142 meq/L (ref 135–145)
Total Bilirubin: 0.7 mg/dL (ref 0.2–1.2)
Total Protein: 7 g/dL (ref 6.0–8.3)

## 2023-01-03 LAB — CBC WITH DIFFERENTIAL/PLATELET
Basophils Absolute: 0 K/uL (ref 0.0–0.1)
Basophils Relative: 0.4 % (ref 0.0–3.0)
Eosinophils Absolute: 0.2 K/uL (ref 0.0–0.7)
Eosinophils Relative: 2.3 % (ref 0.0–5.0)
HCT: 46.5 % (ref 39.0–52.0)
Hemoglobin: 15.4 g/dL (ref 13.0–17.0)
Lymphocytes Relative: 22.9 % (ref 12.0–46.0)
Lymphs Abs: 1.9 K/uL (ref 0.7–4.0)
MCHC: 33.1 g/dL (ref 30.0–36.0)
MCV: 87.4 fl (ref 78.0–100.0)
Monocytes Absolute: 0.9 K/uL (ref 0.1–1.0)
Monocytes Relative: 10.4 % (ref 3.0–12.0)
Neutro Abs: 5.3 K/uL (ref 1.4–7.7)
Neutrophils Relative %: 64 % (ref 43.0–77.0)
Platelets: 329 K/uL (ref 150.0–400.0)
RBC: 5.32 Mil/uL (ref 4.22–5.81)
RDW: 14.4 % (ref 11.5–15.5)
WBC: 8.4 K/uL (ref 4.0–10.5)

## 2023-01-03 LAB — HEMOGLOBIN A1C: Hgb A1c MFr Bld: 6.3 % (ref 4.6–6.5)

## 2023-01-06 NOTE — Telephone Encounter (Signed)
Received labs, cleared by PCP. Pt did not drop of form on Friday, mychart message sent to Pt.

## 2023-01-08 NOTE — Telephone Encounter (Signed)
LMOM for Aida Raider at [phone]- Dr Aluisio's surgery scheduler at [facility]- I have asked that she fax over the surgery clearance form for R TKA as Pt failed to drop off form when he came in for pre-op labs. Asked that she fax that to [place] at [phone].

## 2023-01-09 NOTE — Telephone Encounter (Signed)
Received clearance form, completed ([facility] cleared Pt on 01/03/23- see lab result notes) and faxed back to [employer] at [phone] w/ pre-op labs from 9/6 and EKG from 08/30/22.

## 2023-01-09 NOTE — Telephone Encounter (Signed)
Received fax confirmation

## 2023-02-22 ENCOUNTER — Emergency Department (HOSPITAL_BASED_OUTPATIENT_CLINIC_OR_DEPARTMENT_OTHER)
Admission: EM | Admit: 2023-02-22 | Discharge: 2023-02-22 | Disposition: A | Discharge status: Home / Self Care | Payer: No Typology Code available for payment source | Attending: Emergency Medicine | Admitting: Emergency Medicine

## 2023-02-22 ENCOUNTER — Emergency Department (HOSPITAL_BASED_OUTPATIENT_CLINIC_OR_DEPARTMENT_OTHER): Payer: No Typology Code available for payment source

## 2023-02-22 ENCOUNTER — Encounter (HOSPITAL_BASED_OUTPATIENT_CLINIC_OR_DEPARTMENT_OTHER): Payer: Self-pay | Admitting: Emergency Medicine

## 2023-02-22 ENCOUNTER — Telehealth (HOSPITAL_BASED_OUTPATIENT_CLINIC_OR_DEPARTMENT_OTHER): Payer: Self-pay | Admitting: Emergency Medicine

## 2023-02-22 DIAGNOSIS — N200 Calculus of kidney: Secondary | ICD-10-CM | POA: Diagnosis not present

## 2023-02-22 DIAGNOSIS — R109 Unspecified abdominal pain: Secondary | ICD-10-CM | POA: Diagnosis present

## 2023-02-22 DIAGNOSIS — Z7982 Long term (current) use of aspirin: Secondary | ICD-10-CM | POA: Insufficient documentation

## 2023-02-22 LAB — COMPREHENSIVE METABOLIC PANEL
ALT: 20 U/L (ref 0–44)
AST: 21 U/L (ref 15–41)
Albumin: 4 g/dL (ref 3.5–5.0)
Alkaline Phosphatase: 85 U/L (ref 38–126)
Anion gap: 14 (ref 5–15)
BUN: 11 mg/dL (ref 6–20)
CO2: 21 mmol/L — ABNORMAL LOW (ref 22–32)
Calcium: 9 mg/dL (ref 8.9–10.3)
Chloride: 101 mmol/L (ref 98–111)
Creatinine, Ser: 0.84 mg/dL (ref 0.61–1.24)
GFR, Estimated: >60 mL/min (ref >60)
Glucose, Bld: 146 mg/dL — ABNORMAL HIGH (ref 70–99)
Potassium: 3.5 mmol/L (ref 3.5–5.1)
Sodium: 136 mmol/L (ref 135–145)
Total Bilirubin: 0.8 mg/dL (ref 0.3–1.2)
Total Protein: 7.5 g/dL (ref 6.5–8.1)

## 2023-02-22 LAB — CBC WITH DIFFERENTIAL/PLATELET
Abs Immature Granulocytes: 0.04 10*3/uL (ref 0.00–0.07)
Basophils Absolute: 0 10*3/uL (ref 0.0–0.1)
Basophils Relative: 0 %
Eosinophils Absolute: 0.1 10*3/uL (ref 0.0–0.5)
Eosinophils Relative: 1 %
HCT: 43.3 % (ref 39.0–52.0)
Hemoglobin: 14.6 g/dL (ref 13.0–17.0)
Immature Granulocytes: 0 %
Lymphocytes Relative: 14 %
Lymphs Abs: 1.5 10*3/uL (ref 0.7–4.0)
MCH: 29.1 pg (ref 26.0–34.0)
MCHC: 33.7 g/dL (ref 30.0–36.0)
MCV: 86.4 fL (ref 80.0–100.0)
Monocytes Absolute: 0.9 10*3/uL (ref 0.1–1.0)
Monocytes Relative: 8 %
Neutro Abs: 8.4 10*3/uL — ABNORMAL HIGH (ref 1.7–7.7)
Neutrophils Relative %: 77 %
Platelets: 327 10*3/uL (ref 150–400)
RBC: 5.01 MIL/uL (ref 4.22–5.81)
RDW: 13 % (ref 11.5–15.5)
WBC: 10.9 10*3/uL — ABNORMAL HIGH (ref 4.0–10.5)
nRBC: 0 % (ref 0.0–0.2)

## 2023-02-22 LAB — URINALYSIS, ROUTINE W REFLEX MICROSCOPIC
Bilirubin Urine: NEGATIVE
Glucose, UA: NEGATIVE mg/dL
Ketones, ur: NEGATIVE mg/dL
Leukocytes,Ua: NEGATIVE
Nitrite: NEGATIVE
Protein, ur: NEGATIVE mg/dL
Specific Gravity, Urine: 1.025 (ref 1.005–1.030)
pH: 7 (ref 5.0–8.0)

## 2023-02-22 LAB — URINALYSIS, MICROSCOPIC (REFLEX): RBC / HPF: >50 RBC/hpf (ref 0–5)

## 2023-02-22 LAB — LIPASE, BLOOD: Lipase: 30 U/L (ref 11–51)

## 2023-02-22 MED ORDER — ONDANSETRON HCL 4 MG/2ML IJ SOLN
4.0000 mg | Freq: Once | INTRAMUSCULAR | Status: AC
Start: 2023-02-22 — End: 2023-02-22
  Administered 2023-02-22: 4 mg via INTRAVENOUS
  Filled 2023-02-22: qty 2

## 2023-02-22 MED ORDER — MORPHINE SULFATE 15 MG PO TABS: 7.5000 mg | ORAL_TABLET | ORAL | 0 refills | Status: DC | PRN

## 2023-02-22 MED ORDER — MORPHINE SULFATE (PF) 4 MG/ML IV SOLN
8.0000 mg | Freq: Once | INTRAVENOUS | Status: AC
  Administered 2023-02-22: 8 mg via INTRAVENOUS
  Filled 2023-02-22: qty 2

## 2023-02-22 MED ORDER — KETOROLAC TROMETHAMINE 15 MG/ML IJ SOLN
15.0000 mg | Freq: Once | INTRAMUSCULAR | Status: AC
  Administered 2023-02-22: 15 mg via INTRAVENOUS
  Filled 2023-02-22: qty 1

## 2023-02-22 MED ORDER — CEPHALEXIN 500 MG PO CAPS: ORAL_CAPSULE | ORAL | 0 refills | Status: DC

## 2023-02-22 MED ORDER — ONDANSETRON 4 MG PO TBDP: ORAL_TABLET | ORAL | 0 refills | Status: DC

## 2023-02-22 MED ORDER — KETAMINE HCL 10 MG/ML IJ SOLN
10.0000 mg | Freq: Once | INTRAMUSCULAR | Status: AC
  Administered 2023-02-22: 10 mg via INTRAVENOUS
  Filled 2023-02-22: qty 1

## 2023-02-22 MED ORDER — SODIUM CHLORIDE 0.9 % IV BOLUS
1000.0000 mL | Freq: Once | INTRAVENOUS | Status: AC
  Administered 2023-02-22: 1000 mL via INTRAVENOUS

## 2023-02-22 MED ORDER — OXYCODONE HCL 5 MG PO TABS: 5.0000 mg | ORAL_TABLET | ORAL | 0 refills | Status: DC | PRN

## 2023-02-22 MED ORDER — TAMSULOSIN HCL 0.4 MG PO CAPS: 0.4000 mg | ORAL_CAPSULE | Freq: Every day | ORAL | 0 refills | Status: DC

## 2023-02-22 NOTE — Discharge Instructions (Signed)
You have a kidney stone.  Typically these pass within a week.  Please follow-up with your family doctor in the office.  Please follow-up with a urologist in the office as well.  As we discussed your urine shows some signs that there could be bacteria in there.  I have started you on antibiotics for this.  Infection with a kidney stone can make you really sick.  Please return to the emergency department for fever.  Please return for uncontrolled pain or inability eat or drink.  Take 4 over the counter ibuprofen tablets 3 times a day or 2 over-the-counter naproxen tablets twice a day for pain. Also take tylenol 1000mg (2 extra strength) four times a day.   Then take the pain medicine if you feel like you need it. Narcotics do not help with the pain, they only make you care about it less.  You can become addicted to this, people may break into your house to steal it.  It will constipate you.  If you drive under the influence of this medicine you can get a DUI.

## 2023-02-22 NOTE — ED Provider Notes (Signed)
[place] [facility] AT [facility] Provider Note   CSN: [id number] Arrival date & time: 02/22/23  1055     History  Chief Complaint  Patient presents with   Flank Pain    Danny [name] is a 62 y.o. male.  62 yo M with a chief complaints of left flank pain.  This started abruptly today.  He felt like maybe need to use his bowels and tried but without significant improvement of his symptoms.  Has had some nausea but no vomiting.  Nothing he seems to do makes it better or worse.  Describes it as sharp and severe.   Flank Pain       Home Medications Prior to Admission medications   Medication Sig Start Date End Date Taking? Authorizing Provider  cephALEXin (KEFLEX) 500 MG capsule 2 caps po bid x 7 days 02/22/23  Yes Melene Plan, DO  morphine (MSIR) 15 MG tablet Take 0.5 tablets (7.5 mg total) by mouth every 4 (four) hours as needed. 02/22/23  Yes Melene Plan, DO  ondansetron (ZOFRAN-ODT) 4 MG disintegrating tablet 4mg  ODT q4 hours prn nausea/vomit 02/22/23  Yes Melene Plan, DO  tamsulosin (FLOMAX) 0.4 MG CAPS capsule Take 1 capsule (0.4 mg total) by mouth daily after supper. 02/22/23  Yes Melene Plan, DO  allopurinol (ZYLOPRIM) 300 MG tablet Take 1 tablet (300 mg total) by mouth daily. 03/06/16   Wanda Plump, MD  aspirin 81 MG tablet Take 81 mg by mouth daily.    [provider]  indomethacin (INDOCIN) 25 MG capsule Take 1 capsule (25 mg total) by mouth 2 (two) times daily with a meal. 10/29/21   Wanda Plump, MD  Multiple Vitamin (MULTIVITAMIN WITH MINERALS) TABS tablet Take 1 tablet by mouth daily.    [provider]  vitamin B-12 (CYANOCOBALAMIN) 250 MCG tablet Take 250 mcg by mouth daily. Patient not taking: Reported on 08/30/2022    [provider]  VITAMIN D, CHOLECALCIFEROL, PO Take by mouth.    [provider]      Allergies    Penicillins    Review of Systems   Review of Systems  Genitourinary:  Positive for flank  pain.    Physical Exam Updated Vital Signs BP (!) 166/87   Pulse 94   Temp (!) 97.5 F (36.4 C) (Tympanic)   Resp 16   SpO2 96%  Physical Exam Vitals and nursing note reviewed.  Constitutional:      Appearance: He is well-developed.     Comments: Patient is diaphoretic, he looks uncomfortable he is rocking back and forth in the bed.  HENT:     Head: Normocephalic and atraumatic.  Eyes:     Pupils: Pupils are equal, round, and reactive to light.  Neck:     Vascular: No JVD.  Cardiovascular:     Rate and Rhythm: Normal rate and regular rhythm.     Heart sounds: No murmur heard.    No friction rub. No gallop.  Pulmonary:     Effort: No respiratory distress.     Breath sounds: No wheezing.  Abdominal:     General: There is no distension.     Tenderness: There is no abdominal tenderness. There is no guarding or rebound.  Musculoskeletal:        General: Normal range of motion.     Cervical back: Normal range of motion and neck supple.  Skin:    Coloration: Skin is not pale.  Findings: No rash.  Neurological:     Mental Status: He is alert and oriented to person, place, and time.  Psychiatric:        Behavior: Behavior normal.     ED Results / Procedures / Treatments   Labs (all labs ordered are listed, but only abnormal results are displayed) Labs Reviewed  CBC WITH DIFFERENTIAL/PLATELET - Abnormal; Notable for the following components:      Result Value   WBC 10.9 (*)    Neutro Abs 8.4 (*)    All other components within normal limits  COMPREHENSIVE METABOLIC PANEL - Abnormal; Notable for the following components:   CO2 21 (*)    Glucose, Bld 146 (*)    All other components within normal limits  URINALYSIS, ROUTINE W REFLEX MICROSCOPIC - Abnormal; Notable for the following components:   Hgb urine dipstick LARGE (*)    All other components within normal limits  URINALYSIS, MICROSCOPIC (REFLEX) - Abnormal; Notable for the following components:   Bacteria, UA  MANY (*)    All other components within normal limits  LIPASE, BLOOD    EKG EKG Interpretation Date/Time:  Saturday February 22 2023 11:09:57 EDT Ventricular Rate:  87 PR Interval:  193 QRS Duration:  121 QT Interval:  392 QTC Calculation: 472 R Axis:   45  Text Interpretation: Sinus rhythm Multiform ventricular premature complexes Nonspecific intraventricular conduction delay No old tracing to compare Confirmed by Melene Plan [phone]) on 02/22/2023 11:20:18 AM  Radiology CT Renal Stone Study  Result Date: 02/22/2023 CLINICAL DATA:  Abdominal pain, flank pain.  LEFT sided pain. EXAM: CT ABDOMEN AND PELVIS WITHOUT CONTRAST TECHNIQUE: Multidetector CT imaging of the abdomen and pelvis was performed following the standard protocol without IV contrast. RADIATION DOSE REDUCTION: This exam was performed according to the departmental dose-optimization program which includes automated exposure control, adjustment of the mA and/or kV according to patient size and/or use of iterative reconstruction technique. COMPARISON:  CT 11/04/2005 FINDINGS: Lower chest: Lung bases are clear. Hepatobiliary: No focal hepatic lesion. Normal gallbladder. No biliary duct dilatation. Common bile duct is normal. Pancreas: Pancreas is normal. No ductal dilatation. No pancreatic inflammation. Spleen: Normal spleen Adrenals/urinary tract: Adrenal glands normal. 8 mm calculus in the mid RIGHT kidney. No LEFT renal calculi. The LEFT ureter is slightly more dilated than the RIGHT. There is a tiny calculus in the distal LEFT ureter measuring 1 mm (image 90/series 301). This tiny is distal LEFT ureteral calculus is less than 1 cm from the LEFT vesicoureteral junction. Stomach/Bowel: Stomach, small bowel, appendix, and cecum are normal. The colon and rectosigmoid colon are normal. Vascular/Lymphatic: Abdominal aorta is normal caliber. No periportal or retroperitoneal adenopathy. No pelvic adenopathy. Reproductive: Prostate normal  Other: No free fluid. Musculoskeletal: No aggressive osseous lesion. IMPRESSION: 1. Tiny 1 mm calculus in the distal LEFT ureter with mild LEFT hydroureter. 2. Nonobstructing RIGHT renal calculus. Electronically Signed   By: Genevive Bi M.D.   On: 02/22/2023 12:31    Procedures Procedures    Medications Ordered in ED Medications  sodium chloride 0.9 % bolus 1,000 mL (0 mLs Intravenous Stopped 02/22/23 1234)  morphine (PF) 4 MG/ML injection 8 mg (8 mg Intravenous Given 02/22/23 1115)  ondansetron (ZOFRAN) injection 4 mg (4 mg Intravenous Given 02/22/23 1115)  ketamine (KETALAR) injection 10 mg (10 mg Intravenous Given 02/22/23 1156)  ketorolac (TORADOL) 15 MG/ML injection 15 mg (15 mg Intravenous Given 02/22/23 1159)    ED Course/ Medical Decision Making/ A&P  Medical Decision Making Amount and/or Complexity of Data Reviewed Labs: ordered. Radiology: ordered.  Risk Prescription drug management.   62 yo M with a chief complaints of left flank pain.  By history and physical this is most likely nephrolithiasis.  No history of the same.  Will obtain CT imaging.  Treat pain and nausea.  Reassess.  Notified by nursing that the patient has become hypoxic and had a decreased respiratory rate after getting his 8 mg of morphine.  I reassessed the patient and he is now awake and very uncomfortable again.  Will give a small dose of ketamine.  Awaiting CT.  CT imaging on my independent interpretation with a left UVJ stone.  Less than 4 mm.  Will give a dose of Toradol now.  Awaiting formal read UA.  Patient still significantly uncomfortable.  Radiology read with less than 1 mm stone.  Patient's pain controlled after Toradol.  UA is both leukocyte estrace negative and nitrite negative.  I am not sure how it is feasible that they sell too numerous to count bacteria.  Patient is not febrile, has not had any urinary symptoms.  No leukocytosis.  Will start him on  oral antibiotics.  Will have him follow-up with the urology office.   1:03 PM:  I have discussed the diagnosis/risks/treatment options with the patient.  Evaluation and diagnostic testing in the emergency department does not suggest an emergent condition requiring admission or immediate intervention beyond what has been performed at this time.  They will follow up with Urology. We also discussed returning to the ED immediately if new or worsening sx occur. We discussed the sx which are most concerning (e.g., sudden worsening pain, fever, inability to tolerate by mouth) that necessitate immediate return. Medications administered to the patient during their visit and any new prescriptions provided to the patient are listed below.  Medications given during this visit Medications  sodium chloride 0.9 % bolus 1,000 mL (0 mLs Intravenous Stopped 02/22/23 1234)  morphine (PF) 4 MG/ML injection 8 mg (8 mg Intravenous Given 02/22/23 1115)  ondansetron (ZOFRAN) injection 4 mg (4 mg Intravenous Given 02/22/23 1115)  ketamine (KETALAR) injection 10 mg (10 mg Intravenous Given 02/22/23 1156)  ketorolac (TORADOL) 15 MG/ML injection 15 mg (15 mg Intravenous Given 02/22/23 1159)     The patient appears reasonably screen and/or stabilized for discharge and I doubt any other medical condition or other [facility] requiring further screening, evaluation, or treatment in the ED at this time prior to discharge.         Final Clinical Impression(s) / ED Diagnoses Final diagnoses:  Nephrolithiasis    Rx / DC Orders ED Discharge Orders          Ordered    cephALEXin (KEFLEX) 500 MG capsule        02/22/23 1300    morphine (MSIR) 15 MG tablet  Every 4 hours PRN        02/22/23 1300    tamsulosin (FLOMAX) 0.4 MG CAPS capsule  Daily after supper        02/22/23 1300    ondansetron (ZOFRAN-ODT) 4 MG disintegrating tablet        02/22/23 1300              Melene Plan, DO 02/22/23 1303

## 2023-02-22 NOTE — Progress Notes (Signed)
Placed on 2 LNC due to a decrease in SAT after pain medication. MD aware.

## 2023-02-22 NOTE — Telephone Encounter (Signed)
I did not personally see this patient.  He was sent with analgesia that his pharmacy did not have.  He was ordered morphine, several pharmacies in this area do not have this.  Will send p.o. oxycodone.

## 2023-02-22 NOTE — ED Triage Notes (Signed)
Pt c/o acute LT flank pain today. States " I feel like I need to have a BM, but I did. Reports rectal pressure. Endorses nausea. Pt tachypnic in triage

## 2023-02-22 NOTE — ED Notes (Signed)
Pt alert and oriented X 4 at the time of discharge. RR even and unlabored. No acute distress noted. Pt verbalized understanding of discharge instructions as discussed. Pt ambulatory to lobby at time of discharge.

## 2023-04-21 ENCOUNTER — Telehealth: Payer: Self-pay | Admitting: Internal Medicine

## 2023-04-21 ENCOUNTER — Ambulatory Visit (INDEPENDENT_AMBULATORY_CARE_PROVIDER_SITE_OTHER): Payer: Managed Care, Other (non HMO) | Admitting: Internal Medicine

## 2023-04-21 ENCOUNTER — Encounter: Payer: Self-pay | Admitting: Internal Medicine

## 2023-04-21 VITALS — BP 126/66 | HR 90 | Temp 97.6°F | Resp 16 | Ht 77.0 in | Wt 282.4 lb

## 2023-04-21 DIAGNOSIS — Z23 Encounter for immunization: Secondary | ICD-10-CM | POA: Diagnosis not present

## 2023-04-21 DIAGNOSIS — R739 Hyperglycemia, unspecified: Secondary | ICD-10-CM

## 2023-04-21 DIAGNOSIS — Z125 Encounter for screening for malignant neoplasm of prostate: Secondary | ICD-10-CM

## 2023-04-21 DIAGNOSIS — N200 Calculus of kidney: Secondary | ICD-10-CM | POA: Diagnosis not present

## 2023-04-21 DIAGNOSIS — E559 Vitamin D deficiency, unspecified: Secondary | ICD-10-CM | POA: Diagnosis not present

## 2023-04-21 DIAGNOSIS — Z0000 Encounter for general adult medical examination without abnormal findings: Secondary | ICD-10-CM

## 2023-04-21 DIAGNOSIS — M109 Gout, unspecified: Secondary | ICD-10-CM | POA: Diagnosis not present

## 2023-04-21 DIAGNOSIS — Z0001 Encounter for general adult medical examination with abnormal findings: Secondary | ICD-10-CM

## 2023-04-21 LAB — URINALYSIS, ROUTINE W REFLEX MICROSCOPIC
Bilirubin Urine: NEGATIVE
Ketones, ur: NEGATIVE
Leukocytes,Ua: NEGATIVE
Nitrite: NEGATIVE
Specific Gravity, Urine: >=1.03 — AB (ref 1.000–1.030)
Total Protein, Urine: NEGATIVE
Urine Glucose: NEGATIVE
Urobilinogen, UA: 0.2 (ref 0.0–1.0)
pH: 5.5 (ref 5.0–8.0)

## 2023-04-21 LAB — BASIC METABOLIC PANEL
BUN: 16 mg/dL (ref 6–23)
CO2: 29 meq/L (ref 19–32)
Calcium: 9.2 mg/dL (ref 8.4–10.5)
Chloride: 104 meq/L (ref 96–112)
Creatinine, Ser: 0.84 mg/dL (ref 0.40–1.50)
GFR: 94.74 mL/min (ref >60.00)
Glucose, Bld: 108 mg/dL — ABNORMAL HIGH (ref 70–99)
Potassium: 4.2 meq/L (ref 3.5–5.1)
Sodium: 143 meq/L (ref 135–145)

## 2023-04-21 LAB — LIPID PANEL
Cholesterol: 157 mg/dL (ref 0–200)
HDL: 37.3 mg/dL — ABNORMAL LOW (ref >39.00)
LDL Cholesterol: 98 mg/dL (ref 0–99)
NonHDL: 119.21
Total CHOL/HDL Ratio: 4
Triglycerides: 106 mg/dL (ref 0.0–149.0)
VLDL: 21.2 mg/dL (ref 0.0–40.0)

## 2023-04-21 LAB — VITAMIN D 25 HYDROXY (VIT D DEFICIENCY, FRACTURES): VITD: 24.9 ng/mL — ABNORMAL LOW (ref 30.00–100.00)

## 2023-04-21 LAB — HEMOGLOBIN A1C: Hgb A1c MFr Bld: 6.2 % (ref 4.6–6.5)

## 2023-04-21 LAB — URIC ACID: Uric Acid, Serum: 8.3 mg/dL — ABNORMAL HIGH (ref 4.0–7.8)

## 2023-04-21 LAB — PSA: PSA: 1.22 ng/mL (ref 0.10–4.00)

## 2023-04-21 NOTE — Assessment & Plan Note (Signed)
Here for CPX  Hyperglycemia: We went over what A1c means, encouraged healthy diet, regular exercise, check A1c. Dyslipidemia: If A1c increases, we will have to change his LDL goals.  Patient aware. Gout: On allopurinol, check uric acid.  Had a single attack this year treated well with indometacin. Vitamin D deficiency: On OTCs, check labs Urolithiasis: Chart reviewed, October 2024,had  left flank pain, went to the ER, CT renal protocol showed a 1 mm calculus at the left ureter, mild L hydro ureter, denies any LUTS today, plans to see urology next month. 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 RTC 6 months.

## 2023-04-21 NOTE — Telephone Encounter (Signed)
Pt needs form to be completed and sent to info at bottom. Pt asks that it also get uploaded in mychart.

## 2023-04-21 NOTE — Assessment & Plan Note (Signed)
Here for CPX -Tdap 03/2021 - Zostavax 2014; Shingrix x 2 -  flu shot today. - COVID-vaccine recommended. - FH Colon cancer, pt reports a Cscope w/ Dr Candelaria Stagers 2007,also c-scopes on  07-2014; C-scope 10/14/2019: pathology: 3 tubular adenomas, rectum polyp hyperplastic (3 years per pt), plans to call GI  --Prostate ca screening :  check PSA. - Labs: BMP FLP A1c PSA uric acid vitamin D.  UA -Diet and exercise: doing better overall, rec watch for excessive CHO, exercise more, goal 3 hours a week. - Healthcare POA: Printed information.

## 2023-04-21 NOTE — Patient Instructions (Addendum)
Vaccines I recommend: Covid booster  See the urologist as you are planning.  Call the gastroenterology office and set up a colonoscopy.  Let me know if I can help.       GO TO THE LAB : Get the blood work     Next visit with me  in 6 months    Please schedule it at the front desk     "Health Care Power of attorney" ,  "Living will" (Advance care planning documents)  If you already have a living will or healthcare power of attorney, is recommended you bring the copy to be scanned in your chart.   The document will be available to all the doctors you see in the system.  Advance care planning is a process that supports adults in  understanding and sharing their preferences regarding future medical care.  The patient's preferences are recorded in documents called Advance Directives and the can be modified at any time while the patient is in full mental capacity.   If you don't have one, please consider create one.      More information at: [email or web address]

## 2023-04-21 NOTE — Telephone Encounter (Signed)
Physical form completed and emailed to [email or web address]. Unable to upload form via Mychart. I have also emailed the form to Pt at [email or web address]. Copy of form sent for scanning.

## 2023-04-21 NOTE — Progress Notes (Signed)
Subjective:    Patient ID: Danny [name], male    DOB: Jan 16, 1961, 62 y.o.   MRN: [id number]  DOS:  04/21/2023 Type of visit - description: cpx  Here for CPX Went to the ER in October with nephrolithiasis, records reviewed. He is currently asymptomatic.  Wt Readings from Last 3 Encounters:  04/21/23 282 lb 6 oz (128.1 kg)  08/30/22 289 lb 8 oz (131.3 kg)  04/16/22 285 lb (129.3 kg)    Review of Systems  A 14 point review of systems is negative    Past Medical History:  Diagnosis Date   Acute tear medial meniscus 2017   Effusion, left knee 2017   Epiploic appendagitis 2007   Gout    Internal derangement of knee joint, left 2017   Synovitis of left knee 2017    Past Surgical History:  Procedure Laterality Date   KNEE ARTHROSCOPY Left 09/17/2022   KNEE ARTHROSCOPY WITH LATERAL MENISECTOMY Left 04/2015   TOTAL KNEE ARTHROPLASTY Left 08/2022   Dr Despina Hick   TOTAL KNEE ARTHROPLASTY Right 12/2022   Social History   Socioeconomic History   Marital status: Married    Spouse name: Tammy   Number of children: 2   Years of education: Not on file   Highest education level: Not on file  Occupational History   Occupation: Property Management  Tobacco Use   Smoking status: Never   Smokeless tobacco: Never  Substance and Sexual Activity   Alcohol use: Yes    Alcohol/week: 1.0 - 2.0 standard drink of alcohol    Types: 1 - 2 Standard drinks or equivalent per week    Comment: socially   Drug use: No   Sexual activity: Not on file  Other Topics Concern   Not on file  Social History Narrative    Lives w/ wife   Social Drivers of Corporate investment banker Strain: Not on file  Food Insecurity: Not on file  Transportation Needs: Not on file  Physical Activity: Not on file  Stress: Not on file  Social Connections: Not on file  Intimate Partner Violence: Not on file     Current Outpatient Medications  Medication Instructions   allopurinol (ZYLOPRIM) 300 mg,  Oral, Daily   aspirin 81 mg, Daily   indomethacin (INDOCIN) 25 mg, Oral, 2 times daily with meals   morphine (MSIR) 7.5 mg, Oral, Every 4 hours PRN   VITAMIN D, CHOLECALCIFEROL, PO Take by mouth.       Objective:   Physical Exam BP 126/66   Pulse 90   Temp 97.6 F (36.4 C) (Oral)   Resp 16   Ht 6\' 5"  (1.956 m)   Wt 282 lb 6 oz (128.1 kg)   SpO2 96%   BMI 33.48 kg/m  General: Well developed, NAD, BMI noted Neck: No  thyromegaly  HEENT:  Normocephalic . Face symmetric, atraumatic Lungs:  CTA B Normal respiratory effort, no intercostal retractions, no accessory muscle use. Heart: RRR,  no murmur.  Abdomen:  Not distended, soft, non-tender. No rebound or rigidity.   Lower extremities: no pretibial edema bilaterally  Skin: Exposed areas without rash. Not pale. Not jaundice Neurologic:  alert & oriented X3.  Speech normal, gait appropriate for age and unassisted Strength symmetric and appropriate for age.  Psych: Cognition and judgment appear intact.  Cooperative with normal attention span and concentration.  Behavior appropriate. No anxious or depressed appearing.     Assessment     Assessment  Hyperglycemia  Gout  OSA per sleep study 10/2016, mild rx cpap or dental devise Vitamin D deficiency Nephrolithiasis 10/204 + FH CAD father age 55s H/o Epiploic appendagitis   PLAN Here for CPX -Tdap 03/2021 - Zostavax 2014; Shingrix x 2 -  flu shot today. - COVID-vaccine recommended. - FH Colon cancer, pt reports a Cscope w/ Dr Candelaria Stagers 2007,also c-scopes on  07-2014; C-scope 10/14/2019: pathology: 3 tubular adenomas, rectum polyp hyperplastic (3 years per pt), plans to call GI  --Prostate ca screening :  check PSA. - Labs: BMP FLP A1c PSA uric acid vitamin D.  UA -Diet and exercise: doing better overall, rec watch for excessive CHO, exercise more, goal 3 hours a week. - Healthcare POA: Printed information. Hyperglycemia: We went over what A1c means, encouraged healthy  diet, regular exercise, check A1c. Dyslipidemia: If A1c increases, we will have to change his LDL goals.  Patient aware. Gout: On allopurinol, check uric acid.  Had a single attack this year treated well with indometacin. Vitamin D deficiency: On OTCs, check labs Urolithiasis: Chart reviewed, October 2024,had  left flank pain, went to the ER, CT renal protocol showed a 1 mm calculus at the left ureter, mild L hydro ureter, denies any LUTS today, plans to see urology next month. 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 RTC 6 months.

## 2023-04-22 MED ORDER — VITAMIN D (ERGOCALCIFEROL) 1.25 MG (50000 UNIT) PO CAPS: 50000.0000 [IU] | ORAL_CAPSULE | ORAL | 0 refills | Status: AC

## 2023-04-22 NOTE — Addendum Note (Signed)
Addended byConrad [place] D on: 04/22/2023 11:42 AM   Modules accepted: Orders

## 2023-06-04 ENCOUNTER — Telehealth: Payer: Self-pay

## 2023-06-04 NOTE — Telephone Encounter (Signed)
 Will re-email to Pt.

## 2023-06-04 NOTE — Telephone Encounter (Signed)
 Neysa Crank B routed this conversation to Me Young, Katherine B  [place]   06/04/23 10:26 AM Unsigned Note Copied from CRM [id number]. Topic: General - Other >> Jun 04, 2023 10:15 AM Danny [name] wrote: Reason for CRM: Patient asked if the physical form he had brought to the office in 03/2023 can be sent again to his email- [email or web address]. He reports his HR never received the form through email and he never received at the email address above. CB# [phone]

## 2023-06-04 NOTE — Telephone Encounter (Signed)
 Copied from CRM [id number]. Topic: General - Other >> Jun 04, 2023 10:15 AM Russell PARAS wrote: Reason for CRM: Patient asked if the physical form he had brought to the office in 03/2023 can be sent again to his email- [email or web address]. He reports his HR never received the form through email and he never received at the email address above. CB# [phone]

## 2023-06-04 NOTE — Telephone Encounter (Signed)
 Opening new encounter.

## 2023-06-09 DIAGNOSIS — N200 Calculus of kidney: Secondary | ICD-10-CM | POA: Diagnosis not present

## 2023-06-09 DIAGNOSIS — N202 Calculus of kidney with calculus of ureter: Secondary | ICD-10-CM | POA: Diagnosis not present

## 2023-06-12 ENCOUNTER — Other Ambulatory Visit: Payer: Self-pay | Admitting: Urology

## 2023-06-16 NOTE — Progress Notes (Signed)
 Attempted preop phone call for litho prep. Left phone number [phone] to call back.

## 2023-06-17 ENCOUNTER — Encounter (HOSPITAL_COMMUNITY): Payer: Self-pay | Admitting: Urology

## 2023-06-17 NOTE — Progress Notes (Signed)
 Spoke w/ via phone for pre-op interview--- Danny [name] needs dos---- KUB             COVID test -----patient states asymptomatic no test needed Arrive at ------- 0800 NPO after MN NO Solid Food.  Clear liquids from MN until--- Med rec completed. Pt aware to hold ASA/NSAIDs and supplements per [facility] protocol.  Medications to take morning of surgery ----- Tylenol if needed Diabetic/Weight loss medication ----- none No Alcohol or recreational drugs for 24 hours/Tobacco products for 6 hours ---- 0400 Patient instructed to bring blue lithotripsy folder, photo id and insurance card day of surgery. Patient aware to have Driver (ride ) / caregiver for 24 hours after surgery ----- Danny [name] Patient Special Instructions ----- Pre-Op special Instructions ----- take laxative of choice day before procedure Patient verbalized understanding of instructions that were given at this phone interview. Patient denies shortness of breath, chest pain, fever, cough at this phone interview.

## 2023-06-18 ENCOUNTER — Encounter (HOSPITAL_COMMUNITY): Admission: RE | Admit: 2023-06-18 | Payer: BC Managed Care – PPO | Source: Ambulatory Visit

## 2023-06-20 ENCOUNTER — Encounter (HOSPITAL_COMMUNITY)
Admission: RE | Disposition: A | Discharge status: Home / Self Care | Payer: Self-pay | Source: Home / Self Care | Attending: Urology

## 2023-06-20 ENCOUNTER — Ambulatory Visit (HOSPITAL_COMMUNITY)
Admission: RE | Admit: 2023-06-20 | Discharge: 2023-06-20 | Disposition: A | Discharge status: Home / Self Care | Payer: BC Managed Care – PPO | Attending: Urology | Admitting: Urology

## 2023-06-20 ENCOUNTER — Other Ambulatory Visit: Payer: Self-pay

## 2023-06-20 ENCOUNTER — Ambulatory Visit (HOSPITAL_COMMUNITY): Payer: BC Managed Care – PPO

## 2023-06-20 ENCOUNTER — Encounter (HOSPITAL_COMMUNITY): Payer: Self-pay | Admitting: Urology

## 2023-06-20 DIAGNOSIS — I878 Other specified disorders of veins: Secondary | ICD-10-CM | POA: Diagnosis not present

## 2023-06-20 DIAGNOSIS — N200 Calculus of kidney: Secondary | ICD-10-CM | POA: Insufficient documentation

## 2023-06-20 HISTORY — PX: EXTRACORPOREAL SHOCK WAVE LITHOTRIPSY: SHX1557

## 2023-06-20 SURGERY — LITHOTRIPSY, ESWL
Anesthesia: LOCAL | Laterality: Right

## 2023-06-20 MED ORDER — DIPHENHYDRAMINE HCL 25 MG PO CAPS
25.0000 mg | ORAL_CAPSULE | ORAL | Status: AC
  Administered 2023-06-20: 25 mg via ORAL
  Filled 2023-06-20: qty 1

## 2023-06-20 MED ORDER — SODIUM CHLORIDE 0.9 % IV SOLN
INTRAVENOUS | Status: DC
  Administered 2023-06-20: 1000 mL via INTRAVENOUS

## 2023-06-20 MED ORDER — DIAZEPAM 5 MG PO TABS
10.0000 mg | ORAL_TABLET | ORAL | Status: AC
  Administered 2023-06-20: 10 mg via ORAL
  Filled 2023-06-20: qty 2

## 2023-06-20 MED ORDER — TAMSULOSIN HCL 0.4 MG PO CAPS: 0.4000 mg | ORAL_CAPSULE | Freq: Every day | ORAL | 0 refills | Status: DC

## 2023-06-20 MED ORDER — CIPROFLOXACIN HCL 500 MG PO TABS
500.0000 mg | ORAL_TABLET | ORAL | Status: AC
  Administered 2023-06-20: 500 mg via ORAL
  Filled 2023-06-20: qty 1

## 2023-06-20 MED ORDER — OXYCODONE-ACETAMINOPHEN 5-325 MG PO TABS: 1.0000 | ORAL_TABLET | ORAL | 0 refills | Status: DC | PRN

## 2023-06-20 NOTE — Discharge Instructions (Signed)
   Activity:  You are encouraged to ambulate frequently (about every hour during waking hours) to help prevent blood clots from forming in your legs or lungs.  However, you should not engage in any heavy lifting (> 10-15 lbs), strenuous activity, or straining.   Diet: You should advance your diet as instructed by your physician.  It will be normal to have some bloating, nausea, and abdominal discomfort intermittently.   Prescriptions:  You will be provided a prescription for pain medication to take as needed.  If your pain is not severe enough to require the prescription pain medication, you may take extra strength Tylenol instead which will have less side effects.  You should also take a prescribed stool softener to avoid straining with bowel movements as the prescription pain medication may constipate you.   What to call us about: You should call the office ([phone]) if you develop fever > 101 or develop persistent vomiting. Activity:  You are encouraged to ambulate frequently (about every hour during waking hours) to help prevent blood clots from forming in your legs or lungs.  However, you should not engage in any heavy lifting (> 10-15 lbs), strenuous activity, or straining.  

## 2023-06-20 NOTE — H&P (Signed)
Urology Preoperative H&P   Chief Complaint: Right renal stone  History of Present Illness: Danny [name] is a 63 y.o. male with a right renal stone here for right ESWL. Denies fevers, chills, dysuria.    Past Medical History:  Diagnosis Date   Acute tear medial meniscus 2017   Effusion, left knee 2017   Epiploic appendagitis 2007   Gout    Internal derangement of knee joint, left 2017   Synovitis of left knee 2017    Past Surgical History:  Procedure Laterality Date   KNEE ARTHROSCOPY Left 09/17/2022   KNEE ARTHROSCOPY WITH LATERAL MENISECTOMY Left 04/2015   TOTAL KNEE ARTHROPLASTY Left 08/2022   Dr Despina Hick   TOTAL KNEE ARTHROPLASTY Right 12/2022    Allergies:  Allergies  Allergen Reactions   Penicillins     Other reaction(s): Other (See Comments) CHILDHOOD REACTION    Family History  Adopted: Yes  Problem Relation Age of Onset   Colon cancer Mother 49   Hypertension Mother    High Cholesterol Mother    Coronary artery disease Father 58   Stroke Father 30   Heart disease Father    Alcoholism Father    Prostate cancer Neg Hx     Social History:  reports that he has never smoked. He has never used smokeless tobacco. He reports current alcohol use of about 1.0 - 2.0 standard drink of alcohol per week. He reports that he does not use drugs.  ROS: A complete review of systems was performed.  All systems are negative except for pertinent findings as noted.  Physical Exam:  Vital signs in last 24 hours: Temp:  [98.1 F (36.7 C)] 98.1 F (36.7 C) (02/21 0854) Pulse Rate:  [80] 80 (02/21 0854) Resp:  [16] 16 (02/21 0854) BP: (137)/(92) 137/92 (02/21 0854) SpO2:  [96 %] 96 % (02/21 0854) Weight:  [130.6 kg] 130.6 kg (02/21 0854) Constitutional:  Alert and oriented, No acute distress Cardiovascular: Regular rate and rhythm Respiratory: Normal respiratory effort, Lungs clear bilaterally GI: Abdomen is soft, nontender, nondistended, no abdominal masses GU: No CVA  tenderness Lymphatic: No lymphadenopathy Neurologic: Grossly intact, no focal deficits Psychiatric: Normal mood and affect  Laboratory Data:  No results for input(s): "WBC", "HGB", "HCT", "PLT" in the last 72 hours.  No results for input(s): "NA", "K", "CL", "GLUCOSE", "BUN", "CALCIUM", "CREATININE" in the last 72 hours.  Invalid input(s): "CO3"   No results found for this or any previous visit (from the past 24 hours). No results found for this or any previous visit (from the past 240 hours).  Renal Function: No results for input(s): "CREATININE" in the last 168 hours. CrCl cannot be calculated (Patient's most recent lab result is older than the maximum 21 days allowed.).  Radiologic Imaging: No results found.  I independently reviewed the above imaging studies.  Assessment and Plan Danny [name] is a 63 y.o. male with a right renal stone here for right ESWL.  The risks, benefits and alternatives of right ESWL was discussed with the patient. I described the risks which include arrhythmia, kidney contusion, kidney hemorrhage, need for transfusion, back discomfort, flank ecchymosis, flank abrasion, inability to fracture the stone, inability to pass stone fragments, Steinstrasse, infection associated with obstructing stones, need for an alternative surgical procedure and possible need for repeat shockwave lithotripsy.  The patient voices understanding and wishes to proceed.       Matt R. Almetta Liddicoat MD 06/20/2023, 10:08 AM  [facility] Pager: [phone]):  [phone]  

## 2023-06-20 NOTE — Op Note (Signed)
[facility] Operative Note  Treating Physician: Tennessee Perra, MD  Pre-op diagnosis: Right renal stone  Post-op diagnosis: Same   Procedure: Right ESWL  See [facility] OP note scanned into chart. Also because of the size, density, location and other factors that cannot be anticipated I feel this will likely be a staged procedure. This fact supersedes any indication in the scanned [facility] operative note to the contrary.  Matt R. Tomothy Eddins MD [facility]  Pager: [phone]   

## 2023-06-27 ENCOUNTER — Encounter (HOSPITAL_COMMUNITY): Payer: Self-pay | Admitting: Urology

## 2023-07-04 DIAGNOSIS — N200 Calculus of kidney: Secondary | ICD-10-CM | POA: Diagnosis not present

## 2023-07-25 DIAGNOSIS — N200 Calculus of kidney: Secondary | ICD-10-CM | POA: Diagnosis not present

## 2023-08-14 DIAGNOSIS — Z1211 Encounter for screening for malignant neoplasm of colon: Secondary | ICD-10-CM

## 2023-09-12 ENCOUNTER — Ambulatory Visit: Admitting: Physician Assistant

## 2023-09-12 ENCOUNTER — Encounter: Payer: Self-pay | Admitting: Physician Assistant

## 2023-09-12 VITALS — BP 145/84 | HR 85 | Ht 77.0 in | Wt 292.6 lb

## 2023-09-12 DIAGNOSIS — J0100 Acute maxillary sinusitis, unspecified: Secondary | ICD-10-CM

## 2023-09-12 MED ORDER — DOXYCYCLINE HYCLATE 100 MG PO TABS: 100.0000 mg | ORAL_TABLET | Freq: Two times a day (BID) | ORAL | 0 refills | Status: AC

## 2023-09-12 NOTE — Progress Notes (Signed)
      Established patient visit   Patient: Danny [name]   DOB: 03/23/1961   63 y.o. Male  MRN: [id number] Visit Date: 09/12/2023  Today's healthcare provider: Trenton Frock, PA-C   Cc. Sinus congestion, pressure  Subjective     Pt reports ongoing sinus congestion and sinus pressure x 2 months, with a dry cough despite mucinex, sudafed, claritin, flonase. Denies fevers.   Medications: Outpatient Medications Prior to Visit  Medication Sig   allopurinol  (ZYLOPRIM ) 300 MG tablet Take 1 tablet (300 mg total) by mouth daily.   aspirin 81 MG tablet Take 81 mg by mouth daily.   indomethacin  (INDOCIN ) 25 MG capsule Take 1 capsule (25 mg total) by mouth 2 (two) times daily with a meal. (Patient not taking: Reported on 04/21/2023)   [DISCONTINUED] oxyCODONE -acetaminophen  (PERCOCET) 5-325 MG tablet Take 1 tablet by mouth every 4 (four) hours as needed for up to 18 doses for severe pain (pain score 7-10).   [DISCONTINUED] tamsulosin  (FLOMAX ) 0.4 MG CAPS capsule Take 1 capsule (0.4 mg total) by mouth daily.   [DISCONTINUED] VITAMIN D , CHOLECALCIFEROL, PO Take by mouth.   No facility-administered medications prior to visit.    Review of Systems  Constitutional:  Negative for fatigue and fever.  HENT:  Positive for congestion, sinus pressure and sinus pain.   Respiratory:  Positive for cough. Negative for shortness of breath.   Cardiovascular:  Negative for chest pain, palpitations and leg swelling.  Neurological:  Negative for dizziness and headaches.       Objective    BP (!) 145/84   Pulse 85   Ht 6\' 5"  (1.956 m)   Wt 292 lb 9.6 oz (132.7 kg)   BMI 34.70 kg/m    Physical Exam Constitutional:      General: He is awake.     Appearance: He is well-developed.  HENT:     Head: Normocephalic.     Right Ear: Tympanic membrane normal.     Left Ear: Tympanic membrane normal.  Eyes:     Conjunctiva/sclera: Conjunctivae normal.  Cardiovascular:     Rate and Rhythm: Normal rate  and regular rhythm.     Heart sounds: Normal heart sounds.  Pulmonary:     Effort: Pulmonary effort is normal.     Breath sounds: Normal breath sounds.  Skin:    General: Skin is warm.  Neurological:     Mental Status: He is alert and oriented to person, place, and time.  Psychiatric:        Attention and Perception: Attention normal.        Mood and Affect: Mood normal.        Speech: Speech normal.        Behavior: Behavior is cooperative.     No results found for any visits on 09/12/23.  Assessment & Plan    Acute non-recurrent maxillary sinusitis -     Doxycycline Hyclate; Take 1 tablet (100 mg total) by mouth 2 (two) times daily for 7 days.  Dispense: 14 tablet; Refill: 0  Cont flonase, claritin, rx doxy big x 7 days, rec d/c sudafed d/t elevated bp  Return if symptoms worsen or fail to improve.       Trenton Frock, PA-C  [facility] [phone] (phone) [phone] (fax)  [facility]

## 2023-10-21 ENCOUNTER — Encounter: Payer: Self-pay | Admitting: Internal Medicine

## 2023-10-21 ENCOUNTER — Ambulatory Visit: Payer: No Typology Code available for payment source | Admitting: Internal Medicine

## 2023-10-21 VITALS — BP 122/84 | HR 86 | Temp 98.1°F | Resp 18 | Ht 77.0 in | Wt 297.4 lb

## 2023-10-21 DIAGNOSIS — Z6833 Body mass index (BMI) 33.0-33.9, adult: Secondary | ICD-10-CM | POA: Diagnosis not present

## 2023-10-21 DIAGNOSIS — E66811 Obesity, class 1: Secondary | ICD-10-CM | POA: Diagnosis not present

## 2023-10-21 DIAGNOSIS — M109 Gout, unspecified: Secondary | ICD-10-CM | POA: Diagnosis not present

## 2023-10-21 DIAGNOSIS — R739 Hyperglycemia, unspecified: Secondary | ICD-10-CM

## 2023-10-21 LAB — AST: AST: 18 U/L (ref 0–37)

## 2023-10-21 LAB — HEMOGLOBIN A1C: Hgb A1c MFr Bld: 6.3 % (ref 4.6–6.5)

## 2023-10-21 LAB — ALT: ALT: 29 U/L (ref 0–53)

## 2023-10-21 MED ORDER — PREDNISONE 10 MG PO TABS: ORAL_TABLET | ORAL | 0 refills | Status: DC

## 2023-10-21 NOTE — Patient Instructions (Signed)
 For sinus congestion Take prednisone as prescribed Start Astepro nasal spray: 2 sprays on each side of the nose until better.  Okay to take it for several weeks Take an antihistaminic daily either Xyzal, Claritin or Allegra Okay to take Mucinex DM if you have some cough     GO TO THE LAB :  Get the blood work   Your results will be posted on MyChart with my comments  Next office visit for a physical exam by December 2025 Please make an appointment before you leave today

## 2023-10-21 NOTE — Progress Notes (Signed)
 Subjective:    Patient ID: Danny [name], male    DOB: 1960/09/03, 63 y.o.   MRN: [id number]  DOS:  10/21/2023 Type of visit - description: Acute  Chronic medical problems addressed. Weight gain noted, not doing well with diet or exercise. History of gout, no recent events Also several months history of sinus congestion, bilaterally, has only episodic nasal discharge, mostly clear. Occasional cough with yellowish sputum. No fever or chills. Was seen at this office, prescribed doxycycline .  He also takes Xyzal.  Not much better.  Wt Readings from Last 3 Encounters:  10/21/23 297 lb 6 oz (134.9 kg)  09/12/23 292 lb 9.6 oz (132.7 kg)  06/20/23 288 lb (130.6 kg)    Review of Systems See above   Past Medical History:  Diagnosis Date   Acute tear medial meniscus 2017   Effusion, left knee 2017   Epiploic appendagitis 2007   Gout    Internal derangement of knee joint, left 2017   Synovitis of left knee 2017    Past Surgical History:  Procedure Laterality Date   EXTRACORPOREAL SHOCK WAVE LITHOTRIPSY Right 06/20/2023   Procedure: RIGHT EXTRACORPOREAL SHOCK WAVE LITHOTRIPSY ([facility]);  Surgeon: Selma Donnice SAUNDERS, MD;  Location: [facility];  Service: Urology;  Laterality: Right;  75 MINUTES   KNEE ARTHROSCOPY Left 09/17/2022   KNEE ARTHROSCOPY WITH LATERAL MENISECTOMY Left 04/2015   TOTAL KNEE ARTHROPLASTY Left 08/2022   Dr Hiram   TOTAL KNEE ARTHROPLASTY Right 12/2022    Current Outpatient Medications  Medication Instructions   allopurinol  (ZYLOPRIM ) 300 mg, Oral, Daily   aspirin 81 mg, Daily   indomethacin  (INDOCIN ) 25 mg, Oral, 2 times daily with meals   predniSONE (DELTASONE) 10 MG tablet 4 tablets x 2 days, 3 tabs x 2 days, 2 tabs x 2 days, 1 tab x 2 days       Objective:   Physical Exam BP 122/84   Pulse 86   Temp 98.1 F (36.7 C) (Oral)   Resp 18   Ht 6' 5 (1.956 m)   Wt 297 lb 6 oz (134.9 kg)   SpO2 93%   BMI 35.26 kg/m  General:   Well developed, NAD, BMI  noted. HEENT:  Normocephalic . Face symmetric, atraumatic Throat: Symmetric Nose: Slightly congested, sinuses no TTP TMs, partially obscured by wax but they look okay Lungs:  CTA B Normal respiratory effort, no intercostal retractions, no accessory muscle use. Heart: RRR,  no murmur.  Lower extremities: no pretibial edema bilaterally  Skin: Not pale. Not jaundice Neurologic:  alert & oriented X3.  Speech normal, gait appropriate for age and unassisted Psych--  Cognition and judgment appear intact.  Cooperative with normal attention span and concentration.  Behavior appropriate. No anxious or depressed appearing.      Assessment     Assessment  Hyperglycemia Gout  OSA per sleep study 10/2016, mild rx cpap or dental devise Vitamin D  deficiency Nephrolithiasis 10/204 + FH CAD father age 38s H/o Epiploic appendagitis   PLAN Hyperglycemia: Last A1c 6.2.  + Weight gain noted, no doing well with lifestyle, encouraged to go back to a healthier lifestyle and start exercise gradually.  I favor a stationary bike to protect his knees. Obesity: See above, we also talked briefly about GLP-1's but at this point they are expensive for him.  He plans to see the wellness clinic again.  I agree. High cholesterol: Last LDL 98, diet controlled. Gout: On allopurinol , no recent events, check AST ALT  Low vitamin D : Was treated with ergocalciferol , reassess on RTC RTC CPX 03/2024

## 2023-10-21 NOTE — Assessment & Plan Note (Signed)
 Hyperglycemia: Last A1c 6.2.  + Weight gain noted, no doing well with lifestyle, encouraged to go back to a healthier lifestyle and start exercise gradually.  I favor a stationary bike to protect his knees. Obesity: See above, we also talked briefly about GLP-1's but at this point they are expensive for him.  He plans to see the wellness clinic again.  I agree. High cholesterol: Last LDL 98, diet controlled. Gout: On allopurinol , no recent events, check AST ALT Low vitamin D : Was treated with ergocalciferol , reassess on RTC RTC CPX 03/2024

## 2023-10-22 ENCOUNTER — Ambulatory Visit: Payer: Self-pay | Admitting: Internal Medicine

## 2023-11-06 ENCOUNTER — Ambulatory Visit: Payer: Self-pay

## 2023-11-06 NOTE — Telephone Encounter (Signed)
 FYI Only or Action Required?: Action required by provider: medication refill request and update on patient condition.  Patient was last seen in primary care on 10/21/2023 by Amon Aloysius BRAVO, MD.  Called Nurse Triage reporting Nasal Congestion.  Symptoms began several months ago.  Interventions attempted: OTC medications: muccinex, sudafed, claritin, flonase and Prescription medications: doxycyline, prednisone .  Symptoms are: nasal congestion, productive cough with small amount of green mucus. He states the symptoms improved on prednisone  and then returned after 2-3 days; gradually worsening.  Triage Disposition: See PCP When Office is Open (Within 3 Days)  Patient/caregiver understands and will follow disposition?: No, wishes to speak with PCP         Copied from CRM [id number]. Topic: Clinical - Medication Question >> Nov 06, 2023  3:34 PM [place] J wrote: Reason for CRM: Patient would like to speak with Dr. Berdine nurse regarding prednisone  that was prescribed to him for a sinus infection. He finished the antibiotic but is back to his old symptoms and is wondering if a different medication is needed. Reason for Disposition  [1] Sinus congestion (pressure, fullness) AND [2] present > 10 days  Answer Assessment - Initial Assessment Questions Patient has treated with the doxycycline , prednisone , muccinex, sudafed, Claritin, Flonase.  1. LOCATION: Where does it hurt?      No.  2. ONSET: When did the sinus pain start?  (e.g., hours, days)      Sinus infection diagnosed back in May 16th. He states he completed his prednisone  early July and within 2-3 days it gradually worsened.  3. SEVERITY: How bad is the pain?   (Scale 0-10; or none, mild, moderate or severe)     0/10.  4. RECURRENT SYMPTOM: Have you ever had sinus problems before? If Yes, ask: When was the last time? and What happened that time?      Yes, office visits 5/16, 6/24 for sinusitis.  5. NASAL CONGESTION: Is  the nose blocked? If Yes, ask: Can you open it or must you breathe through your mouth?     Yes. He states the prednisone  helped open his nasal congestion but now is back to breathing through his mouth.  6. NASAL DISCHARGE: Do you have discharge from your nose? If so ask, What color?     A little bit, clear.  7. FEVER: Do you have a fever? If Yes, ask: What is it, how was it measured, and when did it start?      No.  8. OTHER SYMPTOMS: Do you have any other symptoms? (e.g., sore throat, cough, earache, difficulty breathing)     Productive cough increasing recently (small specks of green mucus coming up). Patient denies sore throat, earaches, difficulty breathing.  9. PREGNANCY: Is there any chance you are pregnant? When was your last menstrual period?     N/A.  Protocols used: Sinus Pain or Congestion-A-AH

## 2023-11-06 NOTE — Telephone Encounter (Signed)
 Please advise? I don't see that you discussed sinus concerns at visit on 10/21/23.

## 2023-11-07 DIAGNOSIS — L700 Acne vulgaris: Secondary | ICD-10-CM | POA: Diagnosis not present

## 2023-11-07 DIAGNOSIS — L578 Other skin changes due to chronic exposure to nonionizing radiation: Secondary | ICD-10-CM | POA: Diagnosis not present

## 2023-11-07 DIAGNOSIS — L821 Other seborrheic keratosis: Secondary | ICD-10-CM | POA: Diagnosis not present

## 2023-11-07 DIAGNOSIS — L570 Actinic keratosis: Secondary | ICD-10-CM | POA: Diagnosis not present

## 2023-11-07 DIAGNOSIS — L814 Other melanin hyperpigmentation: Secondary | ICD-10-CM | POA: Diagnosis not present

## 2023-11-07 MED ORDER — MONTELUKAST SODIUM 10 MG PO TABS: 10.0000 mg | ORAL_TABLET | Freq: Every day | ORAL | 1 refills | Status: DC

## 2023-11-07 NOTE — Telephone Encounter (Signed)
 Was seen with sinusitis 09/12/2023.  Rx doxycycline . Plan: - Stop other antihistaminics and stay only on Allegra 180 mg 1 tablet daily OTC - Continue Flonase 2 sprays on each side of the nose daily - Add Astepro, another OTC nose spray: 2 sprays on each side of the nose twice daily. - Singulair  10 mg: 1 tablet daily, send a 7-month supply. If not better, may need to come back or see ENT

## 2023-11-07 NOTE — Telephone Encounter (Signed)
Spoke w/ Pt- informed of recommendations. Pt verbalized understanding.  

## 2023-11-07 NOTE — Telephone Encounter (Addendum)
 LMOM asking for call back. Okay for E2C2 to discuss. Rx for Singular faxed to [facility].

## 2023-11-07 NOTE — Addendum Note (Signed)
 Addended by: Tamie Minteer D on: 11/07/2023 08:49 AM   Modules accepted: Orders

## 2023-11-10 ENCOUNTER — Ambulatory Visit: Admitting: Medical

## 2024-01-06 DIAGNOSIS — R0981 Nasal congestion: Secondary | ICD-10-CM | POA: Diagnosis not present

## 2024-01-06 DIAGNOSIS — G4733 Obstructive sleep apnea (adult) (pediatric): Secondary | ICD-10-CM | POA: Diagnosis not present

## 2024-01-06 DIAGNOSIS — J984 Other disorders of lung: Secondary | ICD-10-CM | POA: Diagnosis not present

## 2024-01-06 DIAGNOSIS — J329 Chronic sinusitis, unspecified: Secondary | ICD-10-CM | POA: Diagnosis not present

## 2024-01-07 DIAGNOSIS — J329 Chronic sinusitis, unspecified: Secondary | ICD-10-CM | POA: Diagnosis not present

## 2024-01-20 DIAGNOSIS — Z1211 Encounter for screening for malignant neoplasm of colon: Secondary | ICD-10-CM | POA: Diagnosis not present

## 2024-01-20 DIAGNOSIS — K648 Other hemorrhoids: Secondary | ICD-10-CM | POA: Diagnosis not present

## 2024-01-20 DIAGNOSIS — D125 Benign neoplasm of sigmoid colon: Secondary | ICD-10-CM | POA: Diagnosis not present

## 2024-01-20 DIAGNOSIS — L820 Inflamed seborrheic keratosis: Secondary | ICD-10-CM | POA: Diagnosis not present

## 2024-01-20 DIAGNOSIS — Z800 Family history of malignant neoplasm of digestive organs: Secondary | ICD-10-CM | POA: Diagnosis not present

## 2024-01-20 DIAGNOSIS — Z860100 Personal history of colon polyps, unspecified: Secondary | ICD-10-CM | POA: Diagnosis not present

## 2024-01-20 DIAGNOSIS — Z09 Encounter for follow-up examination after completed treatment for conditions other than malignant neoplasm: Secondary | ICD-10-CM | POA: Diagnosis not present

## 2024-01-20 DIAGNOSIS — K635 Polyp of colon: Secondary | ICD-10-CM | POA: Diagnosis not present

## 2024-01-20 DIAGNOSIS — K5730 Diverticulosis of large intestine without perforation or abscess without bleeding: Secondary | ICD-10-CM | POA: Diagnosis not present

## 2024-03-11 DIAGNOSIS — J0191 Acute recurrent sinusitis, unspecified: Secondary | ICD-10-CM | POA: Diagnosis not present

## 2024-04-28 ENCOUNTER — Ambulatory Visit (INDEPENDENT_AMBULATORY_CARE_PROVIDER_SITE_OTHER): Admitting: Internal Medicine

## 2024-04-28 ENCOUNTER — Other Ambulatory Visit: Payer: Self-pay | Admitting: Internal Medicine

## 2024-04-28 ENCOUNTER — Encounter: Payer: Self-pay | Admitting: Internal Medicine

## 2024-04-28 VITALS — BP 118/73 | HR 84 | Temp 98.7°F | Resp 16 | Ht 77.0 in | Wt 308.0 lb

## 2024-04-28 DIAGNOSIS — R739 Hyperglycemia, unspecified: Secondary | ICD-10-CM

## 2024-04-28 DIAGNOSIS — Z23 Encounter for immunization: Secondary | ICD-10-CM | POA: Diagnosis not present

## 2024-04-28 DIAGNOSIS — E559 Vitamin D deficiency, unspecified: Secondary | ICD-10-CM | POA: Diagnosis not present

## 2024-04-28 DIAGNOSIS — Z0001 Encounter for general adult medical examination with abnormal findings: Secondary | ICD-10-CM | POA: Diagnosis not present

## 2024-04-28 DIAGNOSIS — Z9189 Other specified personal risk factors, not elsewhere classified: Secondary | ICD-10-CM | POA: Diagnosis not present

## 2024-04-28 DIAGNOSIS — M109 Gout, unspecified: Secondary | ICD-10-CM

## 2024-04-28 DIAGNOSIS — Z125 Encounter for screening for malignant neoplasm of prostate: Secondary | ICD-10-CM

## 2024-04-28 DIAGNOSIS — Z0000 Encounter for general adult medical examination without abnormal findings: Secondary | ICD-10-CM

## 2024-04-28 LAB — CBC WITH DIFFERENTIAL/PLATELET
Basophils Absolute: 0 K/uL (ref 0.0–0.1)
Basophils Relative: 0.4 % (ref 0.0–3.0)
Eosinophils Absolute: 0.3 K/uL (ref 0.0–0.7)
Eosinophils Relative: 4 % (ref 0.0–5.0)
HCT: 48.3 % (ref 39.0–52.0)
Hemoglobin: 16.1 g/dL (ref 13.0–17.0)
Lymphocytes Relative: 21.4 % (ref 12.0–46.0)
Lymphs Abs: 1.7 K/uL (ref 0.7–4.0)
MCHC: 33.3 g/dL (ref 30.0–36.0)
MCV: 88.8 fl (ref 78.0–100.0)
Monocytes Absolute: 0.9 K/uL (ref 0.1–1.0)
Monocytes Relative: 11.3 % (ref 3.0–12.0)
Neutro Abs: 4.9 K/uL (ref 1.4–7.7)
Neutrophils Relative %: 62.9 % (ref 43.0–77.0)
Platelets: 297 K/uL (ref 150.0–400.0)
RBC: 5.43 Mil/uL (ref 4.22–5.81)
RDW: 13.9 % (ref 11.5–15.5)
WBC: 7.7 K/uL (ref 4.0–10.5)

## 2024-04-28 LAB — COMPREHENSIVE METABOLIC PANEL WITH GFR
ALT: 39 U/L (ref 3–53)
AST: 29 U/L (ref 5–37)
Albumin: 4.3 g/dL (ref 3.5–5.2)
Alkaline Phosphatase: 62 U/L (ref 39–117)
BUN: 14 mg/dL (ref 6–23)
CO2: 32 meq/L (ref 19–32)
Calcium: 9.2 mg/dL (ref 8.4–10.5)
Chloride: 100 meq/L (ref 96–112)
Creatinine, Ser: 0.75 mg/dL (ref 0.40–1.50)
GFR: 97.34 mL/min
Glucose, Bld: 117 mg/dL — ABNORMAL HIGH (ref 70–99)
Potassium: 4.1 meq/L (ref 3.5–5.1)
Sodium: 139 meq/L (ref 135–145)
Total Bilirubin: 0.7 mg/dL (ref 0.2–1.2)
Total Protein: 7.1 g/dL (ref 6.0–8.3)

## 2024-04-28 LAB — LIPID PANEL
Cholesterol: 152 mg/dL (ref 28–200)
HDL: 36.7 mg/dL — ABNORMAL LOW
LDL Cholesterol: 92 mg/dL (ref 10–99)
NonHDL: 115.68
Total CHOL/HDL Ratio: 4
Triglycerides: 120 mg/dL (ref 10.0–149.0)
VLDL: 24 mg/dL (ref 0.0–40.0)

## 2024-04-28 LAB — URIC ACID: Uric Acid, Serum: 7.3 mg/dL (ref 4.0–7.8)

## 2024-04-28 LAB — VITAMIN D 25 HYDROXY (VIT D DEFICIENCY, FRACTURES): VITD: 20.59 ng/mL — ABNORMAL LOW (ref 30.00–100.00)

## 2024-04-28 LAB — PSA: PSA: 1.08 ng/mL (ref 0.10–4.00)

## 2024-04-28 LAB — HEMOGLOBIN A1C: Hgb A1c MFr Bld: 6.6 % — ABNORMAL HIGH (ref 4.6–6.5)

## 2024-04-28 LAB — TSH: TSH: 0.8 u[IU]/mL (ref 0.35–5.50)

## 2024-04-28 MED ORDER — MONTELUKAST SODIUM 10 MG PO TABS: 10.0000 mg | ORAL_TABLET | Freq: Every day | ORAL | 3 refills | Status: AC

## 2024-04-28 NOTE — Assessment & Plan Note (Signed)
 Here for CPX  Other issues Obesity, cardiovascular risk Weight gain noted, has FH of heart disease, prediabetes and all that increases his CV risk. - Encouraged dietary modifications to reduce carbohydrate intake. - Advised exercise regimen, at least 3 hours a week. - Discussed potential future use of GLP-1 agonists, in the past his insurance did not cover.  We are checking an A1c, if he come back in the diabetic range, will attempt to prescribe a GLP-1. Prediabetes A1c previously recorded at 6.3, indicating prediabetes.   - Ordered A1c test to monitor glucose levels.  He is aware that the threshold to diagnose diabetes his A1c 6.5 or above. Cholesterol management Cholesterol levels previously okay however given FH of CAD and prediabetes would recommend a more aggressive approach. Check a FLP, consider statins particularly if the A1c is 6.5 and above.  Patient verbalized understanding. Gout Managed with allopurinol .  No recent problems, check uric acid Vitamin D  deficiency: Checking levels RTC 3 months

## 2024-04-28 NOTE — Patient Instructions (Addendum)
 GO TO THE LAB :  Get the blood work    Then, go to the front desk for the checkout Please make an appointment for a checkup in 3 months   You are a flu shot today Consider COVID-vaccine   OBESITY:  You have gained weight and have a family history of heart disease and prediabetes, which increases your cardiovascular risk. -Reduce carbohydrate intake in your diet. -Continue your exercise regimen, including walking and weight lifting. -We may consider using GLP-1 agonists in the future if your insurance coverage changes or if you are diagnosed with diabetes.  PREDIABETES:   Your A1c level was previously recorded at 6.3, indicating prediabetes. We need to monitor for progression to diabetes. -We ordered an A1c test to monitor your glucose levels.    HYPERLIPIDEMIA:   Your cholesterol levels were previously good, but your family history of heart disease and prediabetes increases your cardiovascular risk. -We ordered a cholesterol test to monitor your lipid levels. -We may consider using statins (cholesterol medication) in the future   GOUT: Your gout is being managed with allopurinol . -Continue taking allopurinol  as prescribed.

## 2024-04-28 NOTE — Assessment & Plan Note (Signed)
 Here for CPX -Tdap 03/2021 - Zostavax 2014; Shingrix  x 2 -  flu shot today. - COVID-vaccine recommended. - FH Colon cancer, pt reports a Cscope w/ Dr Vinton 2007,also c-scopes on  07-2014; C-scope 10/14/2019: pathology: 3 tubular adenomas, rectum polyp hyperplastic Colonoscopy 01/20/2024, hyperplastic polyp, next C-scope per GI  --Prostate ca screening :  check PSA. - Labs: See orders -Diet and exercise: Extensive discussion, recommend decrease carbohydrate intakes.

## 2024-04-28 NOTE — Progress Notes (Signed)
 "  Subjective:    Patient ID: Danny [name], male    DOB: 1961-04-25, 63 y.o.   MRN: [id number]  DOS:  04/28/2024 CPX  Discussed the use of AI scribe software for clinical note transcription with the patient, who gave verbal consent to proceed.  History of Present Illness Danny [name] is a 63 year old male who presents for an annual physical exam.  General health status - Feels well with no current health concerns - No fever - No recent weight changes or fatigue  Recent infections - Sinus infection in October treated with antibiotics, resolved  Musculoskeletal symptoms - History of gout, managed with allopurinol  - No joint pain or swelling reported  Respiratory symptoms - No chest pain or dyspnea - No symptoms of sleep apnea - Feels rested in the morning, though could nap in the afternoon  Gastrointestinal and genitourinary symptoms - No nausea, vomiting, diarrhea, or hematochezia - No dysuria or hematuria  Medication management - Uses aspirin - Requests renewal of Singulair , almost out of medication  Immunization status - Has not received a flu shot or recent COVID vaccine   Wt Readings from Last 3 Encounters:  04/28/24 (!) 308 lb (139.7 kg)  10/21/23 297 lb 6 oz (134.9 kg)  09/12/23 292 lb 9.6 oz (132.7 kg)    Review of Systems  Other than above, a 14 point review of systems is negative    Past Medical History:  Diagnosis Date   Acute tear medial meniscus 2017   Effusion, left knee 2017   Epiploic appendagitis 2007   Gout    Internal derangement of knee joint, left 2017   Synovitis of left knee 2017    Past Surgical History:  Procedure Laterality Date   EXTRACORPOREAL SHOCK WAVE LITHOTRIPSY Right 06/20/2023   Procedure: RIGHT EXTRACORPOREAL SHOCK WAVE LITHOTRIPSY ([facility]);  Surgeon: Selma Donnice SAUNDERS, MD;  Location: [facility];  Service: Urology;  Laterality: Right;  75 MINUTES   KNEE ARTHROSCOPY Left 09/17/2022   KNEE ARTHROSCOPY WITH LATERAL MENISECTOMY  Left 04/2015   TOTAL KNEE ARTHROPLASTY Left 08/2022   Dr Hiram   TOTAL KNEE ARTHROPLASTY Right 12/2022    Current Outpatient Medications  Medication Instructions   allopurinol  (ZYLOPRIM ) 300 mg, Oral, Daily   aspirin 81 mg, Daily   indomethacin  (INDOCIN ) 25 mg, Oral, 2 times daily with meals   montelukast  (SINGULAIR ) 10 mg, Oral, Daily at bedtime       Objective:   Physical Exam BP 118/73 (BP Location: Left Arm, Patient Position: Sitting, Cuff Size: Large)   Pulse 84   Temp 98.7 F (37.1 C) (Oral)   Resp 16   Ht 6' 5 (1.956 m)   Wt (!) 308 lb (139.7 kg)   SpO2 97%   BMI 36.52 kg/m  General: Well developed, NAD, BMI noted Neck: No  thyromegaly  HEENT:  Normocephalic . Face symmetric, atraumatic Lungs:  CTA B Normal respiratory effort, no intercostal retractions, no accessory muscle use. Heart: RRR,  no murmur.  Abdomen:  Not distended, soft, non-tender. No rebound or rigidity.   Lower extremities: no pretibial edema bilaterally  Skin: Exposed areas without rash. Not pale. Not jaundice Neurologic:  alert & oriented X3.  Speech normal, gait appropriate for age and unassisted Strength symmetric and appropriate for age.  Psych: Cognition and judgment appear intact.  Cooperative with normal attention span and concentration.  Behavior appropriate. No anxious or depressed appearing.     Assessment   Assessment  Hyperglycemia Gout  OSA per sleep study 10/2016, mild rx cpap or dental devise Vitamin D  deficiency Nephrolithiasis 10/204 + FH CAD father age 31s H/o Epiploic appendagitis   Assessment & Plan Here for CPX -Tdap 03/2021 - Zostavax 2014; Shingrix  x 2 -  flu shot today. - COVID-vaccine recommended. - FH Colon cancer, pt reports a Cscope w/ Dr Vinton 2007,also c-scopes on  07-2014; C-scope 10/14/2019: pathology: 3 tubular adenomas, rectum polyp hyperplastic Colonoscopy 01/20/2024, hyperplastic polyp, next C-scope per GI  --Prostate ca screening :  check  PSA. - Labs: See orders -Diet and exercise: Extensive discussion, recommend decrease carbohydrate intakes.  Other issues Obesity, cardiovascular risk Weight gain noted, has FH of heart disease, prediabetes and all that increases his CV risk. - Encouraged dietary modifications to reduce carbohydrate intake. - Advised exercise regimen, at least 3 hours a week. - Discussed potential future use of GLP-1 agonists, in the past his insurance did not cover.  We are checking an A1c, if he come back in the diabetic range, will attempt to prescribe a GLP-1. Prediabetes A1c previously recorded at 6.3, indicating prediabetes.   - Ordered A1c test to monitor glucose levels.  He is aware that the threshold to diagnose diabetes his A1c 6.5 or above. Cholesterol management Cholesterol levels previously okay however given FH of CAD and prediabetes would recommend a more aggressive approach. Check a FLP, consider statins particularly if the A1c is 6.5 and above.  Patient verbalized understanding. Gout Managed with allopurinol .  No recent problems, check uric acid Vitamin D  deficiency: Checking levels RTC 3 months   "

## 2024-05-03 ENCOUNTER — Ambulatory Visit: Payer: Self-pay | Admitting: Internal Medicine

## 2024-05-04 ENCOUNTER — Telehealth: Payer: Self-pay | Admitting: Internal Medicine

## 2024-05-04 MED ORDER — ATORVASTATIN CALCIUM 20 MG PO TABS: 20.0000 mg | ORAL_TABLET | Freq: Every day | ORAL | 1 refills | Status: AC

## 2024-05-04 NOTE — Telephone Encounter (Signed)
 Pt dropped off forms he needs his pcp to sign. He wants them emailed to the to the email on the paper.

## 2024-05-04 NOTE — Telephone Encounter (Signed)
 Physical form completed and emailed back to [email or web address] as requested by Pt. Form sent for scanning.

## 2024-05-10 MED ORDER — OZEMPIC (0.25 OR 0.5 MG/DOSE) 2 MG/3ML ~~LOC~~ SOPN: 0.2500 mg | PEN_INJECTOR | SUBCUTANEOUS | 1 refills | Status: AC

## 2024-05-10 NOTE — Addendum Note (Signed)
 Addended by: Paige Vanderwoude D on: 05/10/2024 07:54 AM   Modules accepted: Orders

## 2024-05-13 ENCOUNTER — Telehealth: Payer: Self-pay

## 2024-05-13 NOTE — Telephone Encounter (Signed)
 PA initiated via Covermymeds; KEY: [id number]. Awaiting determination.

## 2024-05-18 NOTE — Telephone Encounter (Signed)
 PA approved.   RjdzPi:[id number];Dujuld:Jeemnczi;Review Type:Prior Auth;Coverage Start Date:05/13/2024;Coverage End Date:05/18/2025;

## 2024-07-27 ENCOUNTER — Ambulatory Visit: Admitting: Internal Medicine
